# Patient Record
Sex: Female | Born: 1955 | Race: Black or African American | Hispanic: No | Marital: Married | Smoking: Former smoker
Health system: Southern US, Community
[De-identification: ages and names within clinical notes are randomized; demographics above are authoritative.]

## PROBLEM LIST (undated history)

## (undated) DIAGNOSIS — M654 Radial styloid tenosynovitis [de Quervain]: Secondary | ICD-10-CM

## (undated) DIAGNOSIS — J45909 Unspecified asthma, uncomplicated: Secondary | ICD-10-CM

## (undated) DIAGNOSIS — I10 Essential (primary) hypertension: Secondary | ICD-10-CM

## (undated) DIAGNOSIS — J302 Other seasonal allergic rhinitis: Secondary | ICD-10-CM

## (undated) DIAGNOSIS — K635 Polyp of colon: Secondary | ICD-10-CM

## (undated) HISTORY — DX: Unspecified asthma, uncomplicated: J45.909

## (undated) HISTORY — DX: Polyp of colon: K63.5

## (undated) HISTORY — DX: Other seasonal allergic rhinitis: J30.2

## (undated) HISTORY — DX: Radial styloid tenosynovitis (de quervain): M65.4

## (undated) HISTORY — DX: Essential (primary) hypertension: I10

---

## 2003-04-16 ENCOUNTER — Other Ambulatory Visit: Admission: RE | Admit: 2003-04-16 | Discharge: 2003-04-16 | Payer: Self-pay | Admitting: *Deleted

## 2003-05-12 HISTORY — PX: TOTAL ABDOMINAL HYSTERECTOMY W/ BILATERAL SALPINGOOPHORECTOMY: SHX83

## 2003-05-19 ENCOUNTER — Ambulatory Visit (HOSPITAL_COMMUNITY): Admission: RE | Admit: 2003-05-19 | Discharge: 2003-05-19 | Payer: Self-pay | Admitting: *Deleted

## 2003-05-19 ENCOUNTER — Encounter (INDEPENDENT_AMBULATORY_CARE_PROVIDER_SITE_OTHER): Payer: Self-pay | Admitting: *Deleted

## 2003-05-22 ENCOUNTER — Encounter (INDEPENDENT_AMBULATORY_CARE_PROVIDER_SITE_OTHER): Payer: Self-pay | Admitting: *Deleted

## 2003-05-22 ENCOUNTER — Inpatient Hospital Stay (HOSPITAL_COMMUNITY): Admission: RE | Admit: 2003-05-22 | Discharge: 2003-05-25 | Payer: Self-pay | Admitting: *Deleted

## 2003-12-09 ENCOUNTER — Emergency Department (HOSPITAL_COMMUNITY): Admission: AD | Admit: 2003-12-09 | Discharge: 2003-12-09 | Payer: Self-pay | Admitting: Family Medicine

## 2004-01-24 ENCOUNTER — Emergency Department (HOSPITAL_COMMUNITY): Admission: EM | Admit: 2004-01-24 | Discharge: 2004-01-24 | Payer: Self-pay | Admitting: Emergency Medicine

## 2005-01-19 ENCOUNTER — Emergency Department (HOSPITAL_COMMUNITY): Admission: EM | Admit: 2005-01-19 | Discharge: 2005-01-19 | Payer: Self-pay | Admitting: Emergency Medicine

## 2005-12-08 ENCOUNTER — Emergency Department (HOSPITAL_COMMUNITY): Admission: EM | Admit: 2005-12-08 | Discharge: 2005-12-08 | Payer: Self-pay | Admitting: Family Medicine

## 2006-05-08 ENCOUNTER — Emergency Department (HOSPITAL_COMMUNITY): Admission: EM | Admit: 2006-05-08 | Discharge: 2006-05-08 | Payer: Self-pay | Admitting: Family Medicine

## 2007-05-30 ENCOUNTER — Emergency Department (HOSPITAL_COMMUNITY): Admission: EM | Admit: 2007-05-30 | Discharge: 2007-05-30 | Payer: Self-pay | Admitting: Family Medicine

## 2007-08-13 ENCOUNTER — Emergency Department (HOSPITAL_COMMUNITY): Admission: EM | Admit: 2007-08-13 | Discharge: 2007-08-13 | Payer: Self-pay | Admitting: Emergency Medicine

## 2008-12-20 ENCOUNTER — Emergency Department (HOSPITAL_COMMUNITY): Admission: EM | Admit: 2008-12-20 | Discharge: 2008-12-20 | Payer: Self-pay | Admitting: Emergency Medicine

## 2011-04-28 NOTE — Discharge Summary (Signed)
NAME:  Natasha Bauer, Natasha Bauer NO.:  [id number]   MEDICAL RECORD NO.:  [id number]                   PATIENT TYPE:  INP   LOCATION:  0442                                 FACILITY:  [facility]   PHYSICIAN:  Pershing Cox, M.D.            DATE OF BIRTH:  11/05/1956   DATE OF ADMISSION:  05/22/2003  DATE OF DISCHARGE:  05/25/2003                                 DISCHARGE SUMMARY   ADMISSION DIAGNOSES:  1. Menometorrhagia.  2. Myomatous uterus.   For details of the patient's admission history and physical, please see the  transcribed note dated May 22, 2003.   HISTORY OF PRESENT ILLNESS:  Briefly, this patient has had an 18 week sized  uterus with very large uterine myomas.  She is brought to the operating room  today for hysterectomy because of anemia associated with bleeding with these  fibroids.   On the day of admission, the patient was taken to the operating room where  under general anesthesia exploratory laparotomy, total abdominal  hysterectomy, bilateral salpingo-oophorectomy was performed.  The procedure  was complicated by the position of the patient's uterine myomas which made  the operation quite complicated.  Her estimated blood loss was 1200 cc  during the operative procedure.   On the evening of surgery, the patient had pain which was well controlled  with her PCA.  She had a very low-grade fever, and her blood pressure was  elevated with a diastolic of 101.  She took a p.o. Ziac that evening and her  blood pressure normalized.  Her urine output was sluggish and she received a  bolus of normal saline.  On the morning of postoperative day #1, the patient  was doing well.  She felt that she was not emptying her bladder and had a  small headache.  She had an in and out catheterization performed after  voiding with a low residual.  On the morning of postoperative day #2, the  patient had no complaints other then gas pains.  There was a small  amount of  drainage on her lower abdomen, and for this reason her wound was probed  without evidence of seroma.  On the morning of postoperative day #3, the  patient had flatus since that morning.  She was nauseated, but she had been  fed sausage for breakfast.  Temperature max was 99.3.  She was able to have  a normal lunch and was able to be discharged that afternoon.  She was given  the following prescriptions:   DISCHARGE MEDICATIONS:  1. Tylox #20 to take one p.o. q.4h. p.r.n. severe pain.  2. Darvocet #40 one or two to take q.4h. p.r.n. mild to moderate pain.  3. Peri-Colace to take in association with any Tylox to prevent any     constipation.  4. Vivelle dot 0.05 mg for hormone replacement therapy.   Final pathology from the patient's surgery showed serosal endometriosis,  endometriosis  of the right ovary with simple paratubal cyst on the left.  Her endometrium showed secretory phase endometrium and leiomyomas  measuring up to 10 cm in size.  The patient's final hemoglobin was 10.4.  She was discharged to come to my office in approximately four days to have  her staples removed.   CONDITION ON DISCHARGE:  Stable.                                                 Pershing Cox, M.D.    MAJ/MEDQ  D:  06/01/2003  T:  06/01/2003  Job:  [id number]   cc:   Soyla Murphy. Renne Crigler, M.D.  [address]  [place]  [place] [address]  Fax: [phone]

## 2011-04-28 NOTE — Op Note (Signed)
   NAME:  Natasha Bauer, Natasha Bauer NO.:  [id number]   MEDICAL RECORD NO.:  [id number]                   PATIENT TYPE:  AMB   LOCATION:  ENDO                                 FACILITY:  [facility]   PHYSICIAN:  Georgiana Spinner, M.D.                 DATE OF BIRTH:  1956/06/19   DATE OF PROCEDURE:  05/19/2003  DATE OF DISCHARGE:                                 OPERATIVE REPORT   PROCEDURE PERFORMED:  Colonoscopy.   INDICATIONS FOR PROCEDURE:  Rectal bleeding.   ANESTHESIA:  Demerol 100, Versed 10 mg.   DESCRIPTION OF PROCEDURE:  With the patient mildly sedated in the left  lateral decubitus position, the Olympus videoscopic colonoscope was inserted  in the rectum and passed under direct vision to the cecum, identified by  ileocecal valve and appendiceal orifice, both of which were photographed.  From this point, the colonoscope was slowly withdrawn taking circumferential  views of the entire colonic mucosa.  Stopping only in the rectosigmoid which  showed some indentation from above to no invasion.  A small polyp was also  seen at that spot.  It was photographed as well.  It was removed using hot  biopsy forceps technique, setting of 20/20 blended current.  The endoscope  was then withdrawn all the way to the rectum which appeared normal on direct  and showed hemorrhoids on retroflexed view.  The endoscope was straightened  and withdrawn.  The patient's vital signs and pulse oximeter remained  stable.  The patient tolerated the procedure well without apparent  complications.   FINDINGS:  Internal hemorrhoids.  Small polyp in the rectosigmoid area with  some indentation from above, indicating some degree of compression from the  fibroid uterus; otherwise an unremarkable colonoscopic examination.   PLAN:  Await biopsy report.  The patient will call me for results and follow  up with me as an outpatient.                                               Georgiana Spinner,  M.D.    GMO/MEDQ  D:  05/19/2003  T:  05/19/2003  Job:  [id number]   cc:   Pershing Cox, M.D.  [address]  [address]  [place]  [place] [address]  Fax: [phone]

## 2011-04-28 NOTE — Op Note (Signed)
NAME:  JOEI, FRANGOS NO.:  [id number]   MEDICAL RECORD NO.:  [id number]                   PATIENT TYPE:  INP   LOCATION:  0001                                 FACILITY:  [facility]   PHYSICIAN:  Pershing Cox, M.D.            DATE OF BIRTH:  03/05/1956   DATE OF PROCEDURE:  05/22/2003  DATE OF DISCHARGE:                                 OPERATIVE REPORT   Natasha Bauer was seen in referral from Dr. Renne Crigler because of a history  of heavy bleeding and episodes of menometorrhagia. On examination, she had a  large mass rising from the pelvis extending to her umbilicus. She had an  ultrasound confirming the presence of some uterine myomas, some as large as  8 cm. She was brought to the operating room today for removal of this uterus  and has been counselled regarding the risks.   FINDINGS:  Examination under anesthesia confirmed the presence of a lower  uterine segment myoma which was large distorting the right perimetrium. For  this reason, a decision was made to make a vertical incision. This was a  good decision as later in the operation in order to get adequate exposure we  had to be able to extend through the umbilicus. The patient had multiple  uterine myomas. Some of these uterine myomas were distorting the normal  anatomy such that they required removal prior to being able to proceed with  the operation. The broader peritoneum was distorted over the lower uterine  segment because of its distortion over the myomas. The right uterine artery  could not be visualized in any way because of the uterine myomas. The  patient's ovaries were normal in appearance. She had requested oophorectomy.  The rectosigmoid was densely adherent to the left ovary and  infundibulopelvic ligament.   DESCRIPTION OF PROCEDURE:  Natasha Bauer was brought to the operating  room with an IV in place. She had received a g of Ancef in the holding area  and PAS stockings  were placed on her side and calves. Supine on the OR  table, general endotracheal anesthesia was administered. She was then placed  into frog leg position and examination under anesthesia was performed. Her  abdomen, perineum and vagina were prepped with Hibiclens. A Foley catheter  was sterilely inserted into the bladder. The patient was draped through a  midline incision and Marcaine was injected into the subumbilical tissues in  the midline. A skin incision was made beneath the umbilicus extending to the  symphysis. The subcutaneous tissues were separated by blunt dissection,  fascia was identified and opened with Bovie cautery extending to the  symphysis. The rectus muscles were separated in the midline and peritoneum  was tinted and opened atraumatically. There was no ascites. The peritoneal  incision was extended superiorly inferiorly to the maximum extent of the  created incision. Exploratory laparotomy was performed and the uterus was  carefully palpated. We removed  the uterus from the peritoneal cavity and  there was very little mobility. There was no side to side movement and very  little anterior movement because of the distortion anteriorly of the large  uterine myoma. A decision was made at this time to extend the incision. I  extended it through the umbilicus as her umbilicus was broad and easily  visible. When the incision had been adequately extended, moist laps were  placed into the upper abdomen in the gutters to retract the bowel. A Balfour  retractor was used to displace the lateral walls and a bladder blade was  placed as well.   Starting on the patient's left, the round ligament was identified and suture  ligated. Rectosigmoid was dissected off of its adherence to the  infundibulopelvic ligament and round ligament. The peritoneum along the  sidewall was opened and the ureter was visualized. The IP ligament could  then be clamped, cut, suture and free tie ligated. At  this point, there was  so little mobility we were unable to do anything else on this site. We  dissected the redundant peritoneum overlying the bladder from the mid  portion of the uterus. By completing this dissection on both sides, we were  then able to move the bladder off of the lower uterine segment.   On the patient's right, the round ligament was identified and transfixed  with suture and held for later in the case. Peritoneum around the round  ligament was incised connecting this to the bladder dissection. The  peritoneal incision was extended so that we could visualize the ureter.  Blood vessels creating the IP ligament were gathered into a bundle, the  pedicle was cut, suture and free tie ligated. The right ovary was densely  adherent to the right sidewall of the uterus extending posteriorly and had  to be taken up by sharp dissection.   Moving back to the left side of the uterus, the uterine artery could be  visualized. It was clamped high and then using a series of clamps cut,  suture and free tie ligated. Continued bleeding from along the sidewall of  the uterus prompted several stitches which contained the bleeding somewhat  but there was a steady ooze from this site until the right uterine artery  could be secured.   A decision was made to perform a myomectomy. Bovie cautery over the top of  the myoma allowed me to retract the serosa. Using towel clips and pressure,  I was able to dissect the myoma around and around until the base could be  secured. Once this had been freed, the bladder was carefully taken down off  of the remainder of the lower uterine segment and the uterine artery was  clamped along the sidewall of the uterus. The ureter was visualized to be  certain that it was safe. This artery was cut, suture and free tie ligated.  A second clamp was placed medial to the uterine artery so that it could be doubly ligated. At this point with both uterine arteries cut,  there was  still a steady ooze from the fundus. A malleable was placed into the cul-de-  sac and using a knife, the fundus of the uterus was excised. Long clamps  were placed along the side of the cervix as it entered the cardinal  ligament. These clamps were cut and Heaney ligated. The uterosacral  ligaments were developed on both sides and Heaney ligated. These were held  for later in the  case. In dissecting down the cervix, there was about a 4 cm  length left. We reached the vagina on the patient's left. The remaining  cervix was excised from the upper vagina. Angled sutures were placed on each  side of the vagina. The vagina was then closed side to side with interrupted  figure-of-eight sutures. The held uterosacral ligaments were tied to the  vaginal angles.   Hemostasis was obtained by cautery and where necessary 3-0 Vicryl sutures. A  small suture was placed in the peritoneum above the right ureter. The ureter  was carefully visualized prior to doing this. There was no bleeding from the  angles. There was bleeding from the base of the bladder and this was  contained by cautery as well. The peritoneal cavity was irrigated and once  hemostasis had been ensured in the pelvis, laps were removed.   The fascia was closed with a running looped double stranded Prolene from the  superior portion of the incision meeting the lower portion at about 3/4 of a  distance of the upper incision. The subcutaneous tissues were closed  using a 3-0 Vicryl interrupted stitch. The upper abdominal skin was closed  using a Monocryl suture, the lower portion of the incision was closed with  skin staples. Estimated blood loss 1200 mL. Urine output 100 mL. Fluids 2400  mL of crystalloid.                                               Pershing Cox, M.D.    MAJ/MEDQ  D:  05/22/2003  T:  05/22/2003  Job:  [id number]   cc:   Gerri Spore B. Earlene Plater, M.D.  [address]  [place]  [place] [address]   Fax: [phone]   Soyla Murphy. Renne Crigler, M.D.  [address]  [place]  [place] [address]  Fax: [phone]

## 2012-07-31 ENCOUNTER — Ambulatory Visit (INDEPENDENT_AMBULATORY_CARE_PROVIDER_SITE_OTHER): Payer: 59 | Admitting: Family Medicine

## 2012-07-31 ENCOUNTER — Encounter: Payer: Self-pay | Admitting: Family Medicine

## 2012-07-31 VITALS — BP 180/104 | HR 60 | Ht 60.0 in | Wt 216.0 lb

## 2012-07-31 DIAGNOSIS — Z800 Family history of malignant neoplasm of digestive organs: Secondary | ICD-10-CM

## 2012-07-31 DIAGNOSIS — I10 Essential (primary) hypertension: Secondary | ICD-10-CM | POA: Insufficient documentation

## 2012-07-31 DIAGNOSIS — J45909 Unspecified asthma, uncomplicated: Secondary | ICD-10-CM | POA: Insufficient documentation

## 2012-07-31 DIAGNOSIS — J309 Allergic rhinitis, unspecified: Secondary | ICD-10-CM | POA: Insufficient documentation

## 2012-07-31 MED ORDER — ALBUTEROL SULFATE HFA 108 (90 BASE) MCG/ACT IN AERS: 2.0000 | INHALATION_SPRAY | Freq: Four times a day (QID) | RESPIRATORY_TRACT | Status: DC | PRN

## 2012-07-31 MED ORDER — LISINOPRIL-HYDROCHLOROTHIAZIDE 10-12.5 MG PO TABS: 1.0000 | ORAL_TABLET | Freq: Every day | ORAL | Status: DC

## 2012-07-31 MED ORDER — MONTELUKAST SODIUM 10 MG PO TABS: 10.0000 mg | ORAL_TABLET | Freq: Every day | ORAL | Status: DC

## 2012-07-31 MED ORDER — FLUTICASONE PROPIONATE 50 MCG/ACT NA SUSP: 2.0000 | Freq: Every day | NASAL | Status: DC

## 2012-07-31 NOTE — Patient Instructions (Addendum)
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  Start taking lisinopril HCTZ every day.  Try and check your blood pressure periodically and bring your machine and the list of blood pressures that you've checked to your next appointment.  This way we can verify the accuracy of your monitor.  Take singulair every day.  This may take 1-2 weeks to become fully effective.  Continue the albuterol as needed.  If this medication doesn't completely help with your congestion and allergy symptoms, then start the Fluticasone nasal spray (this is generic for Flonase, similar to Nasonex but less expensive).    Avoid decongestants ("sinus medications') as these raise blood pressure. Try using Neti-pot or Sinus Rinse kits to help with sinus pressure (once or twice daily)  Use Mucinex as needed for thick mucus (from sinuses or chest)  PLEASE DO NOT TAKE OTHER PEOPLE'S PRESCRIPTION MEDICATIONS!!!   2 Gram Low Sodium Diet A 2 gram sodium diet restricts the amount of sodium in the diet to no more than 2 g or 2000 mg daily. Limiting the amount of sodium is often used to help lower blood pressure. It is important if you have heart, liver, or kidney problems. Many foods contain sodium for flavor and sometimes as a preservative. When the amount of sodium in a diet needs to be low, it is important to know what to look for when choosing foods and drinks. The following includes some information and guidelines to help make it easier for you to adapt to a low sodium diet. QUICK TIPS  Do not add salt to food.   Avoid convenience items and fast food.   Choose unsalted snack foods.   Buy lower sodium products, often labeled as "lower sodium" or "no salt added."   Check food labels to learn how much sodium is in 1 serving.   When eating at a restaurant, ask that your food be prepared with less salt or  none, if possible.  READING FOOD LABELS FOR SODIUM INFORMATION The nutrition facts label is a good place to find how much sodium is in foods. Look for products with no more than 500 to 600 mg of sodium per meal and no more than 150 mg per serving. Remember that 2 g = 2000 mg. The food label may also list foods as:  Sodium-free: Less than 5 mg in a serving.   Very low sodium: 35 mg or less in a serving.   Low-sodium: 140 mg or less in a serving.   Light in sodium: 50% less sodium in a serving. For example, if a food that usually has 300 mg of sodium is changed to become light in sodium, it will have 150 mg of sodium.   Reduced sodium: 25% less sodium in a serving. For example, if a food that usually has 400 mg of sodium is changed to reduced sodium, it will have 300 mg of sodium.  CHOOSING FOODS Grains  Avoid: Salted crackers and snack items. Some cereals, including instant hot cereals. Bread stuffing and biscuit mixes. Seasoned rice or pasta mixes.   Choose: Unsalted snack items. Low-sodium cereals, oats, puffed wheat and rice, shredded wheat. English muffins and bread. Pasta.  Meats  Avoid: Salted, canned, smoked, spiced, pickled meats, including fish and poultry. Bacon, ham, sausage, cold cuts, hot dogs, anchovies.   Choose: Low-sodium canned tuna and salmon. Fresh or frozen meat, poultry, and  fish.  Dairy  Avoid: Processed cheese and spreads. Cottage cheese. Buttermilk and condensed milk. Regular cheese.   Choose: Milk. Low-sodium cottage cheese. Yogurt. Sour cream. Low-sodium cheese.  Fruits and Vegetables  Avoid: Regular canned vegetables. Regular canned tomato sauce and paste. Frozen vegetables in sauces. Olives. Rosita Fire. Relishes. Sauerkraut.   Choose: Low-sodium canned vegetables. Low-sodium tomato sauce and paste. Frozen or fresh vegetables. Fresh and frozen fruit.  Condiments  Avoid: Canned and packaged gravies. Worcestershire sauce. Tartar sauce. Barbecue sauce. Soy  sauce. Steak sauce. Ketchup. Onion, garlic, and table salt. Meat flavorings and tenderizers.   Choose: Fresh and dried herbs and spices. Low-sodium varieties of mustard and ketchup. Lemon juice. Tabasco sauce. Horseradish.  SAMPLE 2 GRAM SODIUM MEAL PLAN Breakfast / Sodium (mg)  1 cup low-fat milk / 143 mg   2 slices whole-wheat toast / 270 mg   1 tbs heart-healthy margarine / 153 mg   1 hard-boiled egg / 139 mg   1 small orange / 0 mg  Lunch / Sodium (mg)  1 cup raw carrots / 76 mg    cup hummus / 298 mg   1 cup low-fat milk / 143 mg    cup red grapes / 2 mg   1 whole-wheat pita bread / 356 mg  Dinner / Sodium (mg)  1 cup whole-wheat pasta / 2 mg   1 cup low-sodium tomato sauce / 73 mg   3 oz lean ground beef / 57 mg   1 small side salad (1 cup raw spinach leaves,  cup cucumber,  cup yellow bell pepper) with 1 tsp olive oil and 1 tsp red wine vinegar / 25 mg  Snack / Sodium (mg)  1 container low-fat vanilla yogurt / 107 mg   3 graham cracker squares / 127 mg  Nutrient Analysis  Calories: 2033   Protein: 77 g   Carbohydrate: 282 g   Fat: 72 g   Sodium: 1971 mg  Document Released: 11/27/2005 Document Revised: 11/16/2011 Document Reviewed: 02/28/2010 [facility] Patient Information 2012 [place], [place].  Diet for GERD or PUD Nutrition therapy can help ease the discomfort of gastroesophageal reflux disease (GERD) and peptic ulcer disease (PUD).  HOME CARE INSTRUCTIONS   Eat your meals slowly, in a relaxed setting.   Eat 5 to 6 small meals per day.   If a food causes distress, stop eating it for a period of time.  FOODS TO AVOID  Coffee, regular or decaffeinated.   Cola beverages, regular or low calorie.   Tea, regular or decaffeinated.   Pepper.   Cocoa.   High fat foods, including meats.   Butter, margarine, hydrogenated oil (trans fats).   Peppermint or spearmint (if you have GERD).   Fruits and vegetables if not tolerated.   Alcohol.    Nicotine (smoking or chewing). This is one of the most potent stimulants to acid production in the gastrointestinal tract.   Any food that seems to aggravate your condition.  If you have questions regarding your diet, ask your caregiver or a registered dietitian. TIPS  Lying flat may make symptoms worse. Keep the head of your bed raised 6 to 9 inches (15 to 23 cm) by using a foam wedge or blocks under the legs of the bed.   Do not lay down until 3 hours after eating a meal.   Daily physical activity may help reduce symptoms.  MAKE SURE YOU:   Understand these instructions.   Will watch your condition.  Will get help right away if you are not doing well or get worse.  Document Released: 11/27/2005 Document Revised: 11/16/2011 Document Reviewed: 10/13/2011 [facility] Patient Information 2012 [place], [place].

## 2012-07-31 NOTE — Progress Notes (Signed)
Chief Complaint  Patient presents with  . Hypertension    new patient to establish today with htn issues. Has ordered her records from Dr. Gaylyn Lambert Prev[facility] but we have not recieved as of yet.   HPI:  Patient is here to establish care, and is requesting refills on her BP medications.  Hypertension:  Diagnosed when she was 20.  Was able to get off medications with weight loss, but then back on after regaining weight.  Most recently she had been taking Diovan HCT 320/25 as prescribed by her former physician--however she recalls getting some splotches on her skin and headaches after taking the medication (every time she picked up a new rx from the pharmacy, lasting 3-4 days).  Due to insurance issues, she hasn't actually taken the Diovan HCT in about 8 months.  Instead, she has taken some of her sister's lisinopril HCT 10/12.5.  She denies having any cough or side effects from this medication.  She hasn't taken any blood pressure medications for 2 weeks.  She recalls BP's being 120's/80's while on the lisinopril medication.  Only recalls having higher BP's when she had pain (sinus infection, headache, asthma, etc).  Ankles seem to be swelling some since she has been out of blood pressure medication.  Has a headache currently, across her forehead/sinuses.  Denies chest pain, palpitations.   Asthma/allergies:  She has had problems with her sinuses and asthma for over 10 years.  She had asthma since childhood.  She had been on Nasonex with good control of her sinuses, but has run out.  It was >$100/month.  She started having some itchy ears, slight cough from postnasal drip last week.  Started having some chest congestion last week, so started using her inhaler, which has helped.  She reports that oral antihistamines seem to work against her, making her worse rather than better, which is why she was put on nasal steroid spray.  Doesn't recall every having tried flonase/fluticasone, nor has she ever  taken Singulair.  Health Maintenance: States she had tetanus through employee health/occ med in May.  Reports having had pneumovax through Dr. Carolee Rota office last year.  Was to have colonoscopy last year, but didn't due to insurance issues.  Past Medical History  Diagnosis Date  . Hypertension age 56  . Asthma   . Seasonal allergies     Past Surgical History  Procedure Date  . Total abdominal hysterectomy w/ bilateral salpingoophorectomy 05/2003    fibroids    History   Social History  . Marital Status: Married    Spouse Name: N/A    Number of Children: N/A  . Years of Education: N/A   Occupational History  . food Market researcher, and private caregiver [employer]   Social History Main Topics  . Smoking status: Former Smoker -- 0.5 packs/day for 8 years    Types: Cigarettes    Quit date: 12/12/1999  . Smokeless tobacco: Never Used  . Alcohol Use: No  . Drug Use: No  . Sexually Active: Yes -- Female partner(s)    Birth Control/ Protection: Surgical   Other Topics Concern  . Not on file   Social History Narrative   Lives with her husband. No children, 1 dog    Family History  Problem Relation Age of Onset  . Hypertension Father   . Cancer Mother 26    colon cancer  . Diabetes Mother   . Arthritis Sister     rheumatoid arthritis  . Hypertension Sister   .  Arthritis Brother     rheumatoid arthritis  . Heart disease Neg Hx     Current outpatient prescriptions:albuterol (PROVENTIL HFA;VENTOLIN HFA) 108 (90 BASE) MCG/ACT inhaler, Inhale 2 puffs into the lungs every 6 (six) hours as needed for wheezing or shortness of breath., Disp: 18 g, Rfl: 1;  Multiple Vitamins-Minerals (CENTRUM SILVER PO), Take 1 tablet by mouth daily., Disp: , Rfl: ;  DISCONTD: albuterol (PROVENTIL HFA;VENTOLIN HFA) 108 (90 BASE) MCG/ACT inhaler, Inhale 2 puffs into the lungs as needed., Disp: , Rfl:  fluticasone (FLONASE) 50 MCG/ACT nasal spray, Place 2 sprays into the nose daily., Disp: 16 g,  Rfl: 6;  lisinopril-hydrochlorothiazide (PRINZIDE,ZESTORETIC) 10-12.5 MG per tablet, Take 1 tablet by mouth daily., Disp: 30 tablet, Rfl: 0;  montelukast (SINGULAIR) 10 MG tablet, Take 1 tablet (10 mg total) by mouth at bedtime., Disp: 30 tablet, Rfl: 5 DISCONTD: lisinopril-hydrochlorothiazide (PRINZIDE,ZESTORETIC) 10-12.5 MG per tablet, Take 1 tablet by mouth daily., Disp: , Rfl:   Allergies  Allergen Reactions  . Codeine Other (See Comments)    headache  . Penicillins Other (See Comments)    headache  . Sinus & Allergy (Pseudoephedrine) Other (See Comments)    Sinus medications do not work for her, they dry her up so badly that they give her asthma attacks.   ROS:  Denies fevers, recent weight changes. Denies nausea, vomiting, diarrhea, bowel changes.  Denies chest pain, palpitations. Occasional heartburn/reflux--recently took someone's Nexium with good results.  Denies skin rashes, bleeding, bruising, depression, anxiety. +edema, shortness of breath/wheezing recently, sinus headache. See HPI  PHYSICAL EXAM: BP 176/100  Pulse 60  Ht 5' (1.524 m)  Wt 216 lb (97.977 kg)  BMI 42.18 kg/m2 180/104 by MD on RA Well developed, pleasant female in no distres HEENT:  PERRL, EOMI, conjunctiva clear.  TM's and EAC's normal.  Nasal mucosa mildly edematous, no purulence or erythema.  Sinuses nontender. Thyroid--borderline size, no mass.  No cervical lymphadenopathy Heart: regular rate and rhythm without murmur Lungs: clear bilaterally Abdomen: soft, nontender, no organomegaly or mass Extremities: no pitting edema, 2+ pulse Neuro: alert and oriented, cranial nerves intact.  Normal strength, gait Psych: normal mood, affect, hygiene and grooming Skin: no rashes, bruising  ASSESSMENT/PLAN: 1. Essential hypertension, benign  lisinopril-hydrochlorothiazide (PRINZIDE,ZESTORETIC) 10-12.5 MG per tablet  2. Asthma  montelukast (SINGULAIR) 10 MG tablet, albuterol (PROVENTIL HFA;VENTOLIN HFA) 108 (90  BASE) MCG/ACT inhaler  3. Allergic rhinitis, cause unspecified  fluticasone (FLONASE) 50 MCG/ACT nasal spray    HTN--currently very high due to being out of medications.  She reports that BP's were okay on her sister's lisinopril HCTZ, so will restart that rather than the diovan HCT which caused her to feel bad.  She will need to return for f/u on blood pressure in 3-4 weeks to ensure this dose is adequate.  Low sodium diet.  Monitor BP elsewhere and bring list, monitor.  Asthma and allergies:  Start singulair 1 tablet daily.  If ineffective in treating allergy symptoms adequately, then start Flonase (as Nasonex was unaffordable).  Continue to use albuterol as needed.  Await Dr. Carolee Rota records to know if/when labs are due.  We should have this by next visit  Family h/o colon cancer in her mother.  She had a colonoscopy approximately 6-7 years ago, and is past due--due every 5 years.  Done through someone at Dr. Leonides Grills records.  She was due last year, but had insurance issues  GERD--reviewed diet.  Don't use other people's medications  Will need  to schedule mammogram and colonoscopy.   Refer to GI (may need to wait for records)

## 2012-08-01 ENCOUNTER — Encounter (HOSPITAL_COMMUNITY): Payer: Self-pay | Admitting: Emergency Medicine

## 2012-08-01 ENCOUNTER — Emergency Department (HOSPITAL_COMMUNITY)
Admission: EM | Admit: 2012-08-01 | Discharge: 2012-08-01 | Disposition: A | Discharge status: Home / Self Care | Payer: 59 | Attending: Emergency Medicine | Admitting: Emergency Medicine

## 2012-08-01 DIAGNOSIS — Z90710 Acquired absence of both cervix and uterus: Secondary | ICD-10-CM | POA: Insufficient documentation

## 2012-08-01 DIAGNOSIS — Z9079 Acquired absence of other genital organ(s): Secondary | ICD-10-CM | POA: Insufficient documentation

## 2012-08-01 DIAGNOSIS — I10 Essential (primary) hypertension: Secondary | ICD-10-CM | POA: Insufficient documentation

## 2012-08-01 DIAGNOSIS — I160 Hypertensive urgency: Secondary | ICD-10-CM

## 2012-08-01 DIAGNOSIS — Z79899 Other long term (current) drug therapy: Secondary | ICD-10-CM | POA: Insufficient documentation

## 2012-08-01 LAB — BASIC METABOLIC PANEL
Calcium: 9.4 mg/dL (ref 8.4–10.5)
GFR calc Af Amer: 85 mL/min — ABNORMAL LOW (ref >90)
GFR calc non Af Amer: 74 mL/min — ABNORMAL LOW (ref >90)
Glucose, Bld: 92 mg/dL (ref 70–99)
Potassium: 4.1 mEq/L (ref 3.5–5.1)
Sodium: 136 mEq/L (ref 135–145)

## 2012-08-01 LAB — TROPONIN I: Troponin I: <0.3 ng/mL (ref <0.30)

## 2012-08-01 MED ORDER — LISINOPRIL 10 MG PO TABS
10.0000 mg | ORAL_TABLET | Freq: Once | ORAL | Status: AC
  Administered 2012-08-01: 10 mg via ORAL
  Filled 2012-08-01: qty 1

## 2012-08-01 NOTE — ED Notes (Signed)
Report received-airway intact-no s/s's of distress-will continue to monitor 

## 2012-08-01 NOTE — ED Notes (Signed)
Pt alert, arrives from home, c/o HTN, seen In PCP today, b/p elevated, resp even unlabored, skin pwd, denies h/a

## 2012-08-01 NOTE — ED Provider Notes (Signed)
History     CSN: [id number]  Arrival date & time 08/01/12  3086   First MD Initiated Contact with Patient 08/01/12 0500      Chief Complaint  Patient presents with  . Hypertension    (Consider location/radiation/quality/duration/timing/severity/associated sxs/prior treatment) HPI Comments: Pt with hx of HTN, just switched to lisinopril/HCTZ combo comes in with cc of elevated BP. Pt states that she checked her BP, and it has been ranging between 170-190 systolic, and decided to come to the ER to make sure everything was OK. She has no n/v/chestpain/sob/headaches/ams.   Patient is a 56 y.o. female presenting with hypertension. The history is provided by the patient.  Hypertension Pertinent negatives include no chest pain, no abdominal pain, no headaches and no shortness of breath.    Past Medical History  Diagnosis Date  . Hypertension age 76  . Asthma   . Seasonal allergies     Past Surgical History  Procedure Date  . Total abdominal hysterectomy w/ bilateral salpingoophorectomy 05/2003    fibroids    Family History  Problem Relation Age of Onset  . Hypertension Father   . Cancer Mother 34    colon cancer  . Diabetes Mother   . Arthritis Sister     rheumatoid arthritis  . Hypertension Sister   . Arthritis Brother     rheumatoid arthritis  . Heart disease Neg Hx     History  Substance Use Topics  . Smoking status: Former Smoker -- 0.5 packs/day for 8 years    Types: Cigarettes    Quit date: 12/12/1999  . Smokeless tobacco: Never Used  . Alcohol Use: No    OB History    Grav Para Term Preterm Abortions TAB SAB Ect Mult Living   0 0 0 0 0 0 0 0 0 0       Review of Systems  HENT: Negative for neck pain.   Respiratory: Negative for shortness of breath.   Cardiovascular: Negative for chest pain.  Gastrointestinal: Negative for nausea, vomiting and abdominal pain.  Genitourinary: Negative for dysuria.  Neurological: Negative for headaches.    Allergies    Codeine; Penicillins; and Sinus & allergy  Home Medications   Current Outpatient Rx  Name Route Sig Dispense Refill  . ALBUTEROL SULFATE HFA 108 (90 BASE) MCG/ACT IN AERS Inhalation Inhale 2 puffs into the lungs every 6 (six) hours as needed for wheezing or shortness of breath. 18 g 1  . FLUTICASONE PROPIONATE 50 MCG/ACT NA SUSP Nasal Place 2 sprays into the nose daily. 16 g 6  . LISINOPRIL-HYDROCHLOROTHIAZIDE 10-12.5 MG PO TABS Oral Take 1 tablet by mouth daily. 30 tablet 0  . MONTELUKAST SODIUM 10 MG PO TABS Oral Take 1 tablet (10 mg total) by mouth at bedtime. 30 tablet 5  . CENTRUM SILVER PO Oral Take 1 tablet by mouth daily.      BP 192/117  Pulse 87  Temp 98 F (36.7 C)  Resp 16  SpO2 99%  Physical Exam  Nursing note and vitals reviewed. Constitutional: She is oriented to person, place, and time. She appears well-developed and well-nourished.  HENT:  Head: Normocephalic and atraumatic.  Eyes: EOM are normal. Pupils are equal, round, and reactive to light.  Neck: Neck supple.  Cardiovascular: Normal rate, regular rhythm and normal heart sounds.   No murmur heard. Pulmonary/Chest: Effort normal. No respiratory distress.  Abdominal: Soft. She exhibits no distension. There is no tenderness. There is no rebound and no guarding.  Neurological: She is alert and oriented to person, place, and time.  Skin: Skin is warm and dry.    ED Course  Procedures (including critical care time)   Labs Reviewed  BASIC METABOLIC PANEL  TROPONIN I   No results found.   No diagnosis found.    MDM   Date: 08/01/2012  Rate: 66  Rhythm: normal sinus rhythm  QRS Axis: normal  Intervals: normal  ST/T Wave abnormalities: normal  Conduction Disutrbances: none  Narrative Interpretation: unremarkable   Pt comes in with cc of HTN. Pt's BP at my eval is 170/105 - but she has been having pressures above the 180 mmhg mark She is asymptomatic. Will get end organ damage labs and  reassess.        Derwood Kaplan, MD 08/01/12 [phone]

## 2012-08-02 ENCOUNTER — Telehealth: Payer: Self-pay | Admitting: Family Medicine

## 2012-08-02 NOTE — Telephone Encounter (Signed)
Patient called in reference to her BP. She states that she has already taken her BP medication this morning. She check her BP and her BP was 204/118 and she wanted to know what she should do about this. While the patient was on hold and we where speaking to Crosby Oyster PA-C about her her issue with her BP. I returned back to the phone and the patient states that she re-check her BP and it went down to 190/109. I explain this to [facility] and he then states he will speak with her directly. CLS

## 2012-08-06 NOTE — Telephone Encounter (Signed)
She will c/t same medication and recheck here with nurse visit next week for BP check.

## 2012-08-08 ENCOUNTER — Encounter: Payer: Self-pay | Admitting: Internal Medicine

## 2012-08-20 HISTORY — PX: COLONOSCOPY: SHX174

## 2012-08-21 ENCOUNTER — Ambulatory Visit (INDEPENDENT_AMBULATORY_CARE_PROVIDER_SITE_OTHER): Payer: 59 | Admitting: Family Medicine

## 2012-08-21 ENCOUNTER — Encounter: Payer: Self-pay | Admitting: Family Medicine

## 2012-08-21 VITALS — BP 134/86 | HR 80 | Ht 60.0 in | Wt 208.0 lb

## 2012-08-21 DIAGNOSIS — Z23 Encounter for immunization: Secondary | ICD-10-CM

## 2012-08-21 DIAGNOSIS — I10 Essential (primary) hypertension: Secondary | ICD-10-CM

## 2012-08-21 DIAGNOSIS — J45909 Unspecified asthma, uncomplicated: Secondary | ICD-10-CM

## 2012-08-21 DIAGNOSIS — J309 Allergic rhinitis, unspecified: Secondary | ICD-10-CM

## 2012-08-21 MED ORDER — LISINOPRIL-HYDROCHLOROTHIAZIDE 10-12.5 MG PO TABS: 1.0000 | ORAL_TABLET | Freq: Every day | ORAL | Status: DC

## 2012-08-21 NOTE — Progress Notes (Signed)
Chief Complaint  Patient presents with  . Hypertension    3 weekbp recheck.   HPI:  Patient presents for f/u on her hypertension, having restarted her on lisinopril HCT at her visit 3 weeks ago.  She ended up going to ER 8/22 (day after her visit here) with elevated blood pressures (asymptomatic).  b-met and troponin, and EKG were normal.  BP's at home on her wrist monitor have been running 117-141/76-95, mainly running 120's-130's/80's. She denies headaches, chest pain, shortness of breath, palpitations.  The day she went to the ER she had some tingling in her hands, so got worried.  No further problems with this.  She is eating healthier, cut out her Cokes (just occasional ginger ale).  She has lost 8 pounds since her last visit, and is feeling very good.  She isn't able to exercise due to working 2 jobs, but walks a lot throughout the day.  Asthma and allergies:  She was also started on singulair at her last visit. Allergies are "doing good", as is her breathing.  She hasn't needed to use rescue inhaler since she started.   Had colonoscopy yesterday--one sessile polyp was noted, and pathology is pending.  Old records were received and reviewed--immunizations added to proper section in Epic (pneumovax and TdaP, UTD). Last labs in old records: 12/2010--normal CBC, chem.  Chol 219, HDL 105, TG 86; LDL 97, chol/HDL ratio 2.1, normal u/a; normal TSH at 0.590  Past Medical History  Diagnosis Date  . Hypertension age 56  . Asthma   . Seasonal allergies   . Colon polyp     hyperplastic 2004; 2013 pending  . Tenosynovitis, de Quervain     Dr. Amanda Pea, resolved   Past Surgical History  Procedure Date  . Total abdominal hysterectomy w/ bilateral salpingoophorectomy 05/2003    fibroids  . Colonoscopy 08/20/12    Dr. Elnoria Howard (sessile polyp)   History   Social History  . Marital Status: Married    Spouse Name: N/A    Number of Children: N/A  . Years of Education: N/A   Occupational History    . 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 Smoking status: Former Smoker -- 0.5 packs/day for 8 years    Types: Cigarettes    Quit date: 12/12/1999  . Smokeless tobacco: Never Used  . Alcohol Use: No  . Drug Use: No  . Sexually Active: Yes -- Female partner(s)    Birth Control/ Protection: Surgical   Other Topics Concern  . Not on file   Social History Narrative   Lives with her husband. No children, 1 dog   Current Outpatient Prescriptions on File Prior to Visit  Medication Sig Dispense Refill  . lisinopril-hydrochlorothiazide (PRINZIDE,ZESTORETIC) 10-12.5 MG per tablet Take 1 tablet by mouth daily.  30 tablet  0  . montelukast (SINGULAIR) 10 MG tablet Take 1 tablet (10 mg total) by mouth at bedtime.  30 tablet  5  . Multiple Vitamins-Minerals (CENTRUM SILVER PO) Take 1 tablet by mouth daily.      Marland Kitchen albuterol (PROVENTIL HFA;VENTOLIN HFA) 108 (90 BASE) MCG/ACT inhaler Inhale 2 puffs into the lungs every 6 (six) hours as needed for wheezing or shortness of breath.  18 g  1  . fluticasone (FLONASE) 50 MCG/ACT nasal spray Place 2 sprays into the nose daily.  16 g  6   Allergies  Allergen Reactions  . Codeine Other (See Comments)    headache  .  Penicillins Other (See Comments)    headache  . Sinus & Allergy (Pseudoephedrine) Other (See Comments)    Sinus medications do not work for her, they dry her up so badly that they give her asthma attacks.   ROS:  Denies fevers, URI symptoms, cough, shortness of breath, headaches, dizziness, tingling, weakness, chest pain, palpitations, GI complaints, GU complaints, rashes, edema or other concerns.  She has some popping in her right ear.  PHYSICAL EXAM: BP 134/86  Pulse 80  Ht 5' (1.524 m)  Wt 208 lb (94.348 kg)  BMI 40.62 kg/m2 Well developed, pleasant female in no distres HEENT:  PERRL, conjunctiva clear.  No nasal drainage or purulence.  OP clear.  TM's and EAC's normal on right. Neck: No  cervical lymphadenopathy or masses Heart: regular rate and rhythm without murmur  Lungs: clear bilaterally  Abdomen: soft, nontender, no organomegaly or mass  Extremities: no pitting edema, 2+ pulse  Neuro: alert and oriented, cranial nerves intact. Normal strength, gait  Psych: normal mood, affect, hygiene and grooming  Skin: no rashes, bruising  BP's also checked with patients 2 monitors she brought in--the wrist monitor appears accurate.  Recent labs from ER:   Chemistry      Component Value Date/Time   NA 136 08/01/2012 0549   K 4.1 08/01/2012 0549   CL 100 08/01/2012 0549   CO2 27 08/01/2012 0549   BUN 18 08/01/2012 0549   CREATININE 0.88 08/01/2012 0549      Component Value Date/Time   CALCIUM 9.4 08/01/2012 0549     ASSESSMENT/PLAN:  1. Essential hypertension, benign  lisinopril-hydrochlorothiazide (PRINZIDE,ZESTORETIC) 10-12.5 MG per tablet  2. Asthma    3. Allergic rhinitis, cause unspecified    4. Need for prophylactic vaccination and inoculation against influenza  Flu vaccine greater than or equal to 3yo preservative free IM    HTN improved--continue current dose.  Asthma and allergies improved.  Continue singulair.  If/when allergies flare, discussed adding antihistamine, and call for Flonase (she previously used Nasonex but too expensive) if needed.  Immunizations UTD.  Flu shot given today.  Colonoscopy yesterday with polyp--pathology of polyp pending (previously had hyperplastic polyps)  Reminded to schedule mammogram.  All questions and concerns were addressed.  F/u 6 months at CPE (r/s CPE she has in October)

## 2012-08-21 NOTE — Patient Instructions (Addendum)
Keep up the good work.  Try and exercise 30 minutes of aerobic exercise daily. Continue weight loss, low sodium diet.  Continue your current medications.  Please remember to schedule your mammogram.  If your allergies aren't well controlled with the Singulair and an antihistamine (ie claritin or zyrtec or allegra), then call for Flonase prescription as we discussed.

## 2012-08-23 ENCOUNTER — Encounter: Payer: Self-pay | Admitting: Family Medicine

## 2012-10-03 ENCOUNTER — Encounter: Payer: Self-pay | Admitting: Family Medicine

## 2012-12-23 ENCOUNTER — Ambulatory Visit (INDEPENDENT_AMBULATORY_CARE_PROVIDER_SITE_OTHER): Payer: 59 | Admitting: Family Medicine

## 2012-12-23 ENCOUNTER — Encounter: Payer: Self-pay | Admitting: Family Medicine

## 2012-12-23 VITALS — BP 152/102 | HR 72 | Temp 98.2°F | Ht 60.0 in | Wt 219.0 lb

## 2012-12-23 DIAGNOSIS — J45909 Unspecified asthma, uncomplicated: Secondary | ICD-10-CM

## 2012-12-23 DIAGNOSIS — I10 Essential (primary) hypertension: Secondary | ICD-10-CM

## 2012-12-23 DIAGNOSIS — J069 Acute upper respiratory infection, unspecified: Secondary | ICD-10-CM

## 2012-12-23 MED ORDER — SULFAMETHOXAZOLE-TRIMETHOPRIM 800-160 MG PO TABS: 1.0000 | ORAL_TABLET | Freq: Two times a day (BID) | ORAL | Status: DC

## 2012-12-23 MED ORDER — IPRATROPIUM BROMIDE 0.06 % NA SOLN: 2.0000 | Freq: Four times a day (QID) | NASAL | Status: DC

## 2012-12-23 NOTE — Progress Notes (Signed)
Chief Complaint  Patient presents with  . Sinusitis    thinks she has a sinus infection that started last Wed. mucus is green in color. No coughing as of yet. Monday b/l ear pain and fullness.   HPI: Began 5 days ago with nasal congestion, sneezing, runny nose.  Having ears popping bilaterally, and sinus pressure in her cheeks.  +PND, phlegm is greenish.  Denies sore throat.  Had some wheezing 4 days ago, needed to use inhaler just one day, not wheezing since.  Had some sweats early on, no known fevers.  Yesterday she lost her voice.   +passive tobacco exposure from her husband. She gets similar illness every January.  Allergies don't usually bother her until the spring.  She uses her Singulair every night, but hasn't been needing Flonase--just started that back up with this illness.  Hasn't really made any difference. She seems very disappointed that it hasn't helped. Denies nausea.  Had loose stool after using Singulair twice in the last few days  Past Medical History  Diagnosis Date  . Hypertension age 59  . Asthma   . Seasonal allergies   . Colon polyp     hyperplastic 2004; 2013 pending  . Tenosynovitis, de Quervain     Dr. Amanda Pea, resolved   Past Surgical History  Procedure Date  . Total abdominal hysterectomy w/ bilateral salpingoophorectomy 05/2003    fibroids  . Colonoscopy 08/20/12    Dr. Elnoria Howard (sessile polyp)   History   Social History  . Marital Status: Married    Spouse Name: N/A    Number of Children: N/A  . Years of Education: N/A   Occupational History  . 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 Smoking status: Former Smoker -- 0.5 packs/day for 8 years    Types: Cigarettes    Quit date: 12/12/1999  . Smokeless tobacco: Never Used  . Alcohol Use: No  . Drug Use: No  . Sexually Active: Yes -- Female partner(s)    Birth Control/ Protection: Surgical   Other Topics Concern  . Not on file   Social History  Narrative   Lives with her husband. No children, 1 dog    Current outpatient prescriptions:aspirin 81 MG tablet, Take 81 mg by mouth daily., Disp: , Rfl: ;  fluticasone (FLONASE) 50 MCG/ACT nasal spray, Place 2 sprays into the nose daily., Disp: 16 g, Rfl: 6;  lisinopril-hydrochlorothiazide (PRINZIDE,ZESTORETIC) 10-12.5 MG per tablet, Take 1 tablet by mouth daily., Disp: 90 tablet, Rfl: 1;  montelukast (SINGULAIR) 10 MG tablet, Take 1 tablet (10 mg total) by mouth at bedtime., Disp: 30 tablet, Rfl: 5 Multiple Vitamins-Minerals (CENTRUM SILVER PO), Take 1 tablet by mouth daily., Disp: , Rfl: ;  albuterol (PROVENTIL HFA;VENTOLIN HFA) 108 (90 BASE) MCG/ACT inhaler, Inhale 2 puffs into the lungs every 6 (six) hours as needed for wheezing or shortness of breath., Disp: 18 g, Rfl: 1  Allergies  Allergen Reactions  . Codeine Other (See Comments)    headache  . Penicillins Other (See Comments)    headache  . Sinus & Allergy (Pseudoephedrine) Other (See Comments)    Sinus medications do not work for her, they dry her up so badly that they give her asthma attacks.   ROS:  Denies fevers, shortness of breath, chest pain, bleeding/bruising, nausea or vomiting, skin rashes or other concerns. +intermittent loose stools, sinus pain, cough, laryngitis  PHYSICAL EXAM: BP 152/102  Pulse 72  Temp  98.2 F (36.8 C) (Oral)  Ht 5' (1.524 m)  Wt 219 lb (99.338 kg)  BMI 42.77 kg/m2  Hoarse female, in no distress. Well-appearing.  No sniffling or coughing in exam room. HEENT:  PERRL, EOMI, conjunctiva clear.  Nasal mucosa mildly edematous, no erythema or purulence.  Sinuses mildly tender x 4, slightly more tender maxillary>frontal per pt OP--moist mucus membranes, no erythema. Posterior view limited Neck: no lymphadenopathy Heart: regular rate and rhythm  Lungs: clear bilaterally Extremities: no edema Psych: normal affect, hygiene and grooming  ASSESSMENT/PLAN: 1. Essential hypertension, benign    2. URI  (upper respiratory infection)  ipratropium (ATROVENT) 0.06 % nasal spray, sulfamethoxazole-trimethoprim (BACTRIM DS,SEPTRA DS) 800-160 MG per tablet  3. Asthma     URI--no evidence to suggest bacterial infection at this time, although would treat with antibiotics if symptoms/discolored mucus doesn't improve over the next 3-5 days.  I recommend using mucinex to loosen up the phlegm, sinus rinses or neti-pot to help with sinus pressure/congestion.  Avoid decongestants (due to high blood pressure, and you don't tolerate them).  I am prescribing Atrovent nasal spray to help with the runny nose (given that she doesn't tolerate other symptomatic meds, and chief complaint is raw nose from the drainage and use of tissues).  Continue singulair for asthma--likely is the reason that she hasn't had a huge flare in wheezing with this illness.  Continue albuterol prn.  Start the Bactrim antibiotic in 3-5 days if you aren't feeling better and mucus is still thick and discolored.  Counseled re: risks/side effects of meds

## 2012-12-23 NOTE — Patient Instructions (Addendum)
  URI--no evidence to suggest bacterial infection at this time, although would treat with antibiotics if symptoms/discolored mucus doesn't improve over the next 3-5 days.  I recommend using mucinex to loosen up the phlegm, sinus rinses or neti-pot to help with sinus pressure/congestion.  Avoid decongestants (due to high blood pressure, and you don't tolerate them).  I am prescribing Atrovent nasal spray to help with the runny nose.  Start the Bactrim antibiotic in 3-5 days if you aren't feeling better and mucus is still thick and discolored.  Please continue to monitor your blood pressure regularly.  It was high today.  Make sure, once feeling better, that it comes back down (to <135/85)

## 2013-02-26 ENCOUNTER — Ambulatory Visit (INDEPENDENT_AMBULATORY_CARE_PROVIDER_SITE_OTHER): Payer: Commercial Managed Care - PPO | Admitting: Family Medicine

## 2013-02-26 ENCOUNTER — Encounter: Payer: Self-pay | Admitting: Family Medicine

## 2013-02-26 VITALS — BP 140/102 | HR 72 | Ht 61.0 in | Wt 217.0 lb

## 2013-02-26 DIAGNOSIS — I10 Essential (primary) hypertension: Secondary | ICD-10-CM

## 2013-02-26 DIAGNOSIS — Z0000 Encounter for general adult medical examination without abnormal findings: Secondary | ICD-10-CM

## 2013-02-26 DIAGNOSIS — J309 Allergic rhinitis, unspecified: Secondary | ICD-10-CM

## 2013-02-26 DIAGNOSIS — J45909 Unspecified asthma, uncomplicated: Secondary | ICD-10-CM

## 2013-02-26 DIAGNOSIS — R5381 Other malaise: Secondary | ICD-10-CM

## 2013-02-26 DIAGNOSIS — R5383 Other fatigue: Secondary | ICD-10-CM

## 2013-02-26 LAB — POCT URINALYSIS DIPSTICK
Glucose, UA: NEGATIVE
Ketones, POC UA: NEGATIVE
Spec Grav, UA: 1.02
Urobilinogen, UA: NEGATIVE

## 2013-02-26 MED ORDER — LISINOPRIL-HYDROCHLOROTHIAZIDE 20-25 MG PO TABS
1.0000 | ORAL_TABLET | Freq: Every day | ORAL | Status: DC
Start: 2013-02-26 — End: 2013-04-30

## 2013-02-26 NOTE — Progress Notes (Signed)
Chief Complaint  Patient presents with  . Annual Exam    CPE without pap, might need pelvic. Patient had coffee with milk this am. No major concerns today. UA  showed trace blood and 1+ leuks, pt asymptomatic.   Natasha Bauer is a 57 y.o. female who presents for a complete physical.  She has the following concerns:  Seen recently with URI, seemed to get better, but a couple of weeks later symptoms recurred, worse.  She took the antibiotics that were prescribed, and got completely better.  Allergies are currently doing well.  She is no longer using the Singulair or Flonase.  She hasn't needed any albuterol.  Hypertension follow-up:  Blood pressures elsewhere are 135/92 (this morning), usually running 140/90.  Denies dizziness, headaches, chest pain.  Denies side effects of medications.  Immunization History  Administered Date(s) Administered  . Influenza Split 08/21/2012  . Pneumococcal Polysaccharide 12/28/2010  . Tdap 04/09/2012   Last Pap smear:  N/a (s/p hysterectomy for benign reasons) Last mammogram: 2 years ago Last colonoscopy: 08/2012, showed sessile polyp Last DEXA: never Dentist: yearly Ophtho: 6 months ago Exercise: limited with cold weather.  Usually walks around the track in the warm weather (1 mile) Last labs done 12/2010--chole 219, HDL 105, TG 86, LDL 97.  Normal CBC, c-met, TSH 0.59  Past Medical History  Diagnosis Date  . Hypertension age 30  . Asthma   . Seasonal allergies   . Colon polyp     hyperplastic in 2004 and 2013   . Tenosynovitis, de Quervain     Dr. Amanda Pea, resolved    Past Surgical History  Procedure Laterality Date  . Total abdominal hysterectomy w/ bilateral salpingoophorectomy  05/2003    fibroids  . Colonoscopy  08/20/12    Dr. Elnoria Howard (hyperplastic polyp)    History   Social History  . Marital Status: Married    Spouse Name: N/A    Number of Children: N/A  . Years of Education: N/A   Occupational History  . food Market researcher,  and private caregiver [employer]   Social History Main Topics  . Smoking status: Former Smoker -- 0.50 packs/day for 8 years    Types: Cigarettes    Quit date: 12/12/1999  . Smokeless tobacco: Never Used  . Alcohol Use: No  . Drug Use: No  . Sexually Active: Yes -- Female partner(s)    Birth Control/ Protection: Surgical   Other Topics Concern  . Not on file   Social History Narrative   Lives with her husband. No children, 1 dog    Family History  Problem Relation Age of Onset  . Hypertension Father   . Cancer Mother 63    colon cancer  . Diabetes Mother   . Arthritis Sister     rheumatoid arthritis  . Hypertension Sister   . Arthritis Brother     rheumatoid arthritis  . Heart disease Neg Hx     Current outpatient prescriptions:aspirin 81 MG tablet, Take 81 mg by mouth daily., Disp: , Rfl: ;  Multiple Vitamins-Minerals (CENTRUM SILVER PO), Take 1 tablet by mouth daily., Disp: , Rfl: ;  albuterol (PROVENTIL HFA;VENTOLIN HFA) 108 (90 BASE) MCG/ACT inhaler, Inhale 2 puffs into the lungs every 6 (six) hours as needed for wheezing or shortness of breath., Disp: 18 g, Rfl: 1 fluticasone (FLONASE) 50 MCG/ACT nasal spray, Place 2 sprays into the nose daily., Disp: 16 g, Rfl: 6;  ipratropium (ATROVENT) 0.06 % nasal spray, Place 2  sprays into the nose 4 (four) times daily. Use as needed for runny nose, Disp: 15 mL, Rfl: 12;  lisinopril-hydrochlorothiazide (PRINZIDE,ZESTORETIC) 20-25 MG per tablet, Take 1 tablet by mouth daily., Disp: 90 tablet, Rfl: 0 montelukast (SINGULAIR) 10 MG tablet, Take 1 tablet (10 mg total) by mouth at bedtime., Disp: 30 tablet, Rfl: 5  Allergies  Allergen Reactions  . Codeine Other (See Comments)    headache  . Penicillins Other (See Comments)    headache  . Sinus & Allergy (Pseudoephedrine) Other (See Comments)    Sinus medications do not work for her, they dry her up so badly that they give her asthma attacks.   ROS:  The patient denies anorexia, fever,  weight changes, headaches,  vision changes, decreased hearing, ear pain, sore throat, breast concerns, chest pain, palpitations, dizziness, syncope, dyspnea on exertion, cough, swelling, nausea, vomiting, diarrhea, constipation, abdominal pain, melena, hematochezia, indigestion/heartburn, hematuria, incontinence, dysuria, vaginal bleeding, discharge, odor or itch, genital lesions, joint pains, numbness, tingling, weakness, tremor, suspicious skin lesions, depression, anxiety, abnormal bleeding/bruising, or enlarged lymph nodes.  PHYSICAL EXAM: BP 140/102  Pulse 72  Ht 5\' 1"  (1.549 m)  Wt 217 lb (98.431 kg)  BMI 41.02 kg/m2  General Appearance:    Alert, cooperative, no distress, appears stated age  Head:    Normocephalic, without obvious abnormality, atraumatic  Eyes:    PERRL, conjunctiva/corneas clear, EOM's intact, fundi    benign  Ears:    Normal TM's and external ear canals  Nose:   Nares normal, mucosa normal, no drainage or sinus   tenderness  Throat:   Lips, mucosa, and tongue normal; teeth and gums normal  Neck:   Supple, no lymphadenopathy;  thyroid:  no   enlargement/tenderness/nodules; no carotid   bruit or JVD  Back:    Spine nontender, no curvature, ROM normal, no CVA     tenderness  Lungs:     Clear to auscultation bilaterally without wheezes, rales or     ronchi; respirations unlabored  Chest Wall:    No tenderness or deformity   Heart:    Regular rate and rhythm, S1 and S2 normal, no murmur, rub   or gallop  Breast Exam:    No tenderness, masses, or nipple discharge or inversion.      No axillary lymphadenopathy  Abdomen:     Soft, non-tender, nondistended, normoactive bowel sounds,    no masses, no hepatosplenomegaly  Genitalia:    Deferred by pt.  Rectal:    Deferred by pt  Extremities:   No clubbing, cyanosis or edema  Pulses:   2+ and symmetric all extremities  Skin:   Skin color, texture, turgor normal, no rashes or lesions  Lymph nodes:   Cervical,  supraclavicular, and axillary nodes normal  Neurologic:   CNII-XII intact, normal strength, sensation and gait; reflexes 2+ and symmetric throughout          Psych:   Normal mood, affect, hygiene and grooming.   ASSESSMENT/PLAN:  Routine general medical examination at a health care facility - Plan: Visual acuity screening, POCT Urinalysis Dipstick  Essential hypertension, benign - suboptimaly controlled.  Increase lisinopril HCT dose - Plan: Lipid panel, Comprehensive metabolic panel, lisinopril-hydrochlorothiazide (PRINZIDE,ZESTORETIC) 20-25 MG per tablet  Allergic rhinitis, cause unspecified  Asthma  Other malaise and fatigue - Plan: CBC with Differential, Vitamin D 25 hydroxy, TSH  Return fasting c-met, lipid, TSH, CBC, Vitamin D  HTN--suboptimally controlled. Increase dose of med.  Return 3-4 weeks for labs,  2 months f/u HTN OV  Low sodium diet Exercise daily, weight loss encouraged.  Schedule mammogram   Allergies/asthma--start up Singulair soon, as spring allergies should start soon, and take through the spring season.  Start back on flonase if singulair isn't adequately treating allergies.  Discussed monthly self breast exams and yearly mammograms after the age of 11; at least 30 minutes of aerobic activity at least 5 days/week; proper sunscreen use reviewed; healthy diet, including goals of calcium and vitamin D intake and alcohol recommendations (less than or equal to 1 drink/day) reviewed; regular seatbelt use; changing batteries in smoke detectors.  Immunization recommendations discussed, UTD--consider zostavax when covered by insurance.  Colonoscopy recommendations reviewed--UTD, due again 2023

## 2013-02-26 NOTE — Patient Instructions (Addendum)
We are increasing your blood pressure medication dose.  Make sure to get some potassium in your diet daily (ie 1/2 banana or orange juice daily). Return in 3-4 week for fasting bloodwork, sooner if having muscle cramps/spasms or other problems.  Continue to check BP daily, write down numbers, and bring list to your next visit in 2 months. You may return sooner if blood pressure are running too high or too low.   Allergies/asthma--start up Singulair soon, as spring allergies should start soon, and take through the spring season.  Start back on flonase if singulair isn't adequately treating allergies.  HEALTH MAINTENANCE RECOMMENDATIONS:  It is recommended that you get at least 30 minutes of aerobic exercise at least 5 days/week (for weight loss, you may need as much as 60-90 minutes). This can be any activity that gets your heart rate up. This can be divided in 10-15 minute intervals if needed, but try and build up your endurance at least once a week.  Weight bearing exercise is also recommended twice weekly.  Eat a healthy diet with lots of vegetables, fruits and fiber.  "Colorful" foods have a lot of vitamins (ie green vegetables, tomatoes, red peppers, etc).  Limit sweet tea, regular sodas and alcoholic beverages, all of which has a lot of calories and sugar.  Up to 1 alcoholic drink daily may be beneficial for women (unless trying to lose weight, watch sugars).  Drink a lot of water.  Calcium recommendations are 1200-1500 mg daily (1500 mg for postmenopausal women or women without ovaries), and vitamin D 1000 IU daily.  This should be obtained from diet and/or supplements (vitamins), and calcium should not be taken all at once, but in divided doses.  Monthly self breast exams and yearly mammograms for women over the age of 27 is recommended.  Sunscreen of at least SPF 30 should be used on all sun-exposed parts of the skin when outside between the hours of 10 am and 4 pm (not just when at beach  or pool, but even with exercise, golf, tennis, and yard work!)  Use a sunscreen that says "broad spectrum" so it covers both UVA and UVB rays, and make sure to reapply every 1-2 hours.  Remember to change the batteries in your smoke detectors when changing your clock times in the spring and fall.  Use your seat belt every time you are in a car, and please drive safely and not be distracted with cell phones and texting while driving.  Sodium-Controlled Diet Sodium is a mineral. It is found in many foods. Sodium may be found naturally or added during the making of a food. The most common form of sodium is salt, which is made up of sodium and chloride. Reducing your sodium intake involves changing your eating habits. The following guidelines will help you reduce the sodium in your diet:  Stop using the salt shaker.  Use salt sparingly in cooking and baking.  Substitute with sodium-free seasonings and spices.  Do not use a salt substitute (potassium chloride) without your caregiver's permission.  Include a variety of fresh, unprocessed foods in your diet.  Limit the use of processed and convenience foods that are high in sodium. USE THE FOLLOWING FOODS SPARINGLY: Breads/Starches  Commercial bread stuffing, commercial pancake or waffle mixes, coating mixes. Waffles. Croutons. Prepared (boxed or frozen) potato, rice, or noodle mixes that contain salt or sodium. Salted [place] fries or hash browns. Salted popcorn, breads, crackers, chips, or snack foods. Vegetables  Vegetables canned  with salt or prepared in cream, butter, or cheese sauces. Sauerkraut. Tomato or vegetable juices canned with salt.  Fresh vegetables are allowed if rinsed thoroughly. Fruit  Fruit is okay to eat. Meat and Meat Substitutes  Salted or smoked meats, such as bacon or Canadian bacon, chipped or corned beef, hot dogs, salt pork, luncheon meats, pastrami, ham, or sausage. Canned or smoked fish, poultry, or meat.  Processed cheese or cheese spreads, blue or Roquefort cheese. Battered or frozen fish products. Prepared spaghetti sauce. Baked beans. Reuben sandwiches. Salted nuts. Caviar. Milk  Limit buttermilk to 1 cup per week. Soups and Combination Foods  Bouillon cubes, canned or dried soups, broth, consomm. Convenience (frozen or packaged) dinners with more than 600 mg sodium. Pot pies, pizza, Asian food, fast food cheeseburgers, and specialty sandwiches. Desserts and Sweets  Regular (salted) desserts, pie, commercial fruit snack pies, commercial snack cakes, canned puddings.  Eat desserts and sweets in moderation. Fats and Oils  Gravy mixes or canned gravy. No more than 1 to 2 tbs of salad dressing. Chip dips.  Eat fats and oils in moderation. Beverages  See those listed under the vegetables and milk groups. Condiments  Ketchup, mustard, meat sauces, salsa, regular (salted) and lite soy sauce or mustard. Dill pickles, olives, meat tenderizer. Prepared horseradish or pickle relish. Dutch-processed cocoa. Baking powder or baking soda used medicinally. Worcestershire sauce. "Light" salt. Salt substitute, unless approved by your caregiver. Document Released: 05/19/2002 Document Revised: 02/19/2012 Document Reviewed: 12/20/2009 [facility] Patient Information 2013 [place], [place].

## 2013-03-26 ENCOUNTER — Other Ambulatory Visit: Payer: Commercial Managed Care - PPO

## 2013-03-26 DIAGNOSIS — R5383 Other fatigue: Secondary | ICD-10-CM

## 2013-03-26 DIAGNOSIS — I10 Essential (primary) hypertension: Secondary | ICD-10-CM

## 2013-03-26 DIAGNOSIS — R5381 Other malaise: Secondary | ICD-10-CM

## 2013-03-26 LAB — CBC WITH DIFFERENTIAL/PLATELET
Eosinophils Absolute: 0.2 10*3/uL (ref 0.0–0.7)
Eosinophils Relative: 3 % (ref 0–5)
HCT: 40.8 % (ref 36.0–46.0)
Hemoglobin: 13.7 g/dL (ref 12.0–15.0)
Lymphocytes Relative: 43 % (ref 12–46)
Lymphs Abs: 2.1 10*3/uL (ref 0.7–4.0)
MCH: 25.9 pg — ABNORMAL LOW (ref 26.0–34.0)
MCV: 77.1 fL — ABNORMAL LOW (ref 78.0–100.0)
Monocytes Absolute: 0.4 10*3/uL (ref 0.1–1.0)
Monocytes Relative: 8 % (ref 3–12)
Platelets: 205 10*3/uL (ref 150–400)
RBC: 5.29 MIL/uL — ABNORMAL HIGH (ref 3.87–5.11)
WBC: 5 10*3/uL (ref 4.0–10.5)

## 2013-03-26 LAB — COMPREHENSIVE METABOLIC PANEL
ALT: 17 U/L (ref 0–35)
AST: 17 U/L (ref 0–37)
Alkaline Phosphatase: 52 U/L (ref 39–117)
Calcium: 9.8 mg/dL (ref 8.4–10.5)
Glucose, Bld: 101 mg/dL — ABNORMAL HIGH (ref 70–99)
Potassium: 4 mEq/L (ref 3.5–5.3)
Sodium: 142 mEq/L (ref 135–145)
Total Bilirubin: 0.3 mg/dL (ref 0.3–1.2)

## 2013-03-26 LAB — LIPID PANEL
HDL: 92 mg/dL (ref >39)
Total CHOL/HDL Ratio: 2.3 Ratio
VLDL: 15 mg/dL (ref 0–40)

## 2013-03-27 LAB — VITAMIN D 25 HYDROXY (VIT D DEFICIENCY, FRACTURES): Vit D, 25-Hydroxy: 35 ng/mL (ref 30–89)

## 2013-04-30 ENCOUNTER — Encounter: Payer: Self-pay | Admitting: Family Medicine

## 2013-04-30 ENCOUNTER — Ambulatory Visit (INDEPENDENT_AMBULATORY_CARE_PROVIDER_SITE_OTHER): Payer: Commercial Managed Care - PPO | Admitting: Family Medicine

## 2013-04-30 VITALS — BP 150/90 | HR 80 | Ht 61.0 in | Wt 210.0 lb

## 2013-04-30 DIAGNOSIS — J45909 Unspecified asthma, uncomplicated: Secondary | ICD-10-CM

## 2013-04-30 DIAGNOSIS — R7301 Impaired fasting glucose: Secondary | ICD-10-CM

## 2013-04-30 DIAGNOSIS — I10 Essential (primary) hypertension: Secondary | ICD-10-CM

## 2013-04-30 DIAGNOSIS — E669 Obesity, unspecified: Secondary | ICD-10-CM

## 2013-04-30 DIAGNOSIS — J309 Allergic rhinitis, unspecified: Secondary | ICD-10-CM

## 2013-04-30 MED ORDER — MONTELUKAST SODIUM 10 MG PO TABS: 10.0000 mg | ORAL_TABLET | Freq: Every day | ORAL | Status: DC

## 2013-04-30 MED ORDER — LISINOPRIL-HYDROCHLOROTHIAZIDE 20-25 MG PO TABS: 1.0000 | ORAL_TABLET | Freq: Every day | ORAL | Status: DC

## 2013-04-30 NOTE — Patient Instructions (Addendum)
Try and get at least 30 minutes of exercise every day, even if just in 10-15 minute intervals.  Continue your current diet, and weight loss. Continue current medications.  Get your husband to quit smoking, or at least to NOT smoke around you--it is bad for your asthma.  Make sure to bring your monitor and list of blood pressures to your next visit.

## 2013-04-30 NOTE — Progress Notes (Signed)
Chief Complaint  Patient presents with  . Hypertension    2 month follow up. Forgot list of bp's at home, she does remember some of the readings.    Patient forgot to bring her BP readings today, but recalls they are running 111-135/68-87.  Dose of medication was increased at her CPE 2 months ago.  Denies headaches, dizziness, chest pain, cough or other side effects of medication. She cut back on salt in the diet, drinking more water, less soda, eating healthier.  She hasn't started any exercise yet. She had labs done last month (after dose increase).  Asthma and allergies: She Is back on Singulair, only needed Flonase for a couple of days.  Allergies and asthma are controlled.  Only needs rescue inhaler about twice a year, not recently. Has passive tobacco exposure from her husband.  Past Medical History  Diagnosis Date  . Hypertension age 57  . Asthma   . Seasonal allergies   . Colon polyp     hyperplastic in 2004 and 2013   . Tenosynovitis, de Quervain     Dr. Amanda Pea, resolved   Past Surgical History  Procedure Laterality Date  . Total abdominal hysterectomy w/ bilateral salpingoophorectomy  05/2003    fibroids  . Colonoscopy  08/20/12    Dr. Elnoria Howard (hyperplastic polyp)   History   Social History  . Marital Status: Married    Spouse Name: N/A    Number of Children: N/A  . Years of Education: N/A   Occupational History  . 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 Smoking status: Former Smoker -- 0.50 packs/day for 8 years    Types: Cigarettes    Quit date: 12/12/1999  . Smokeless tobacco: Never Used  . Alcohol Use: No  . Drug Use: No  . Sexually Active: Yes -- Female partner(s)    Birth Control/ Protection: Surgical   Other Topics Concern  . Not on file   Social History Narrative   Lives with her husband. No children, 1 dog    Current outpatient prescriptions:aspirin 81 MG tablet, Take 81 mg by mouth daily., Disp: , Rfl: ;   lisinopril-hydrochlorothiazide (PRINZIDE,ZESTORETIC) 20-25 MG per tablet, Take 1 tablet by mouth daily., Disp: 90 tablet, Rfl: 0;  montelukast (SINGULAIR) 10 MG tablet, Take 1 tablet (10 mg total) by mouth at bedtime., Disp: 30 tablet, Rfl: 5;  Multiple Vitamins-Minerals (CENTRUM SILVER PO), Take 1 tablet by mouth daily., Disp: , Rfl:  albuterol (PROVENTIL HFA;VENTOLIN HFA) 108 (90 BASE) MCG/ACT inhaler, Inhale 2 puffs into the lungs every 6 (six) hours as needed for wheezing or shortness of breath., Disp: 18 g, Rfl: 1;  fluticasone (FLONASE) 50 MCG/ACT nasal spray, Place 2 sprays into the nose daily., Disp: 16 g, Rfl: 6;  ipratropium (ATROVENT) 0.06 % nasal spray, Place 2 sprays into the nose 4 (four) times daily. Use as needed for runny nose, Disp: 15 mL, Rfl: 12  Allergies  Allergen Reactions  . Codeine Other (See Comments)    headache  . Penicillins Other (See Comments)    headache  . Sinus & Allergy (Pseudoephedrine) Other (See Comments)    Sinus medications do not work for her, they dry her up so badly that they give her asthma attacks.   ROS:  Denies fevers, cough, shortness of breath, URI symptoms, allergies are controlled.  Denies nausea, vomiting, diarrhea, cough, edema or other complaints.  "I'm feeling good!"   PHYSICAL EXAM: BP 150/90  Pulse 80  Ht 5\' 1"  (1.549 m)  Wt 210 lb (95.255 kg)  BMI 39.7 kg/m2 154/92 on repeat by MD.  Pt is excitable today.  Smells of cigarette smoke (from her husband who drove her here) Well developed female in no distress Neck: no lymphadenopathy or mass Heart: regular rate and rhythm without murmur Lungs: clear bilaterally without wheezes Abdomen: soft, nontender, no mass Extremities: no edema Psych: normal mood, affect, hygiene and grooming Neuro: alert and oriented. Cranial nerves intact, normal gait   Lab Results  Component Value Date   WBC 5.0 03/26/2013   HGB 13.7 03/26/2013   HCT 40.8 03/26/2013   MCV 77.1* 03/26/2013   PLT 205  03/26/2013     Chemistry      Component Value Date/Time   NA 142 03/26/2013 0839   K 4.0 03/26/2013 0839   CL 100 03/26/2013 0839   CO2 26 03/26/2013 0839   BUN 16 03/26/2013 0839   CREATININE 0.74 03/26/2013 0839   CREATININE 0.88 08/01/2012 0549      Component Value Date/Time   CALCIUM 9.8 03/26/2013 0839   ALKPHOS 52 03/26/2013 0839   AST 17 03/26/2013 0839   ALT 17 03/26/2013 0839   BILITOT 0.3 03/26/2013 0839     Glucose 101  Lab Results  Component Value Date   CHOL 213* 03/26/2013   HDL 92 03/26/2013   LDLCALC 106* 03/26/2013   TRIG 76 03/26/2013   CHOLHDL 2.3 03/26/2013   ASSESSMENT/PLAN:  Essential hypertension, benign - improved per pt's recall of home numbers; elevated here today.  continue current meds, weight loss, low sodium diet.  daily exercise recommended - Plan: lisinopril-hydrochlorothiazide (PRINZIDE,ZESTORETIC) 20-25 MG per tablet  Obesity, Class II, BMI 35-39.9 - counseled re: diet, exercise, portions and available weight loss options  Impaired fasting glucose - mild; counseled re: exercise, cutting back on sweets and carbs, need for ongoing weight loss  Asthma - well controlled - Plan: montelukast (SINGULAIR) 10 MG tablet  Allergic rhinitis, cause unspecified - controlled  HTN--improved control. Continue current meds.  Continue monitoring BP's at home, bring list to f/u (and monitor, if desires recheck).  Mild impaired fasting glucose.  Continue appropriate diet.  Encouraged daily exercise and continued weight loss.  Lipids are excellent. Labs reviewed with patient. Counseled re: diet, exercise, weight loss. Continue to monitor BP at home.  Bring monitor and list to next visit in 6 months, return sooner if BP's high at home.  Asthma--well controlled Allergies--controlled. Educated about proper use of flonase (not prn).  F/u 6 months for med check

## 2013-11-05 ENCOUNTER — Ambulatory Visit (INDEPENDENT_AMBULATORY_CARE_PROVIDER_SITE_OTHER): Payer: Commercial Managed Care - PPO | Admitting: Family Medicine

## 2013-11-05 ENCOUNTER — Encounter: Payer: Self-pay | Admitting: Family Medicine

## 2013-11-05 VITALS — BP 132/78 | HR 72 | Ht 61.0 in | Wt 206.0 lb

## 2013-11-05 DIAGNOSIS — J45909 Unspecified asthma, uncomplicated: Secondary | ICD-10-CM

## 2013-11-05 DIAGNOSIS — I10 Essential (primary) hypertension: Secondary | ICD-10-CM

## 2013-11-05 DIAGNOSIS — R7301 Impaired fasting glucose: Secondary | ICD-10-CM

## 2013-11-05 DIAGNOSIS — J309 Allergic rhinitis, unspecified: Secondary | ICD-10-CM

## 2013-11-05 DIAGNOSIS — E669 Obesity, unspecified: Secondary | ICD-10-CM

## 2013-11-05 LAB — POCT GLYCOSYLATED HEMOGLOBIN (HGB A1C): Hemoglobin A1C: 5.3

## 2013-11-05 MED ORDER — LISINOPRIL-HYDROCHLOROTHIAZIDE 20-25 MG PO TABS: 1.0000 | ORAL_TABLET | Freq: Every day | ORAL | Status: DC

## 2013-11-05 MED ORDER — MONTELUKAST SODIUM 10 MG PO TABS: 10.0000 mg | ORAL_TABLET | Freq: Every day | ORAL | Status: DC

## 2013-11-05 MED ORDER — ALBUTEROL SULFATE HFA 108 (90 BASE) MCG/ACT IN AERS: 2.0000 | INHALATION_SPRAY | Freq: Four times a day (QID) | RESPIRATORY_TRACT | Status: DC | PRN

## 2013-11-05 NOTE — Patient Instructions (Signed)
Continue to eat healthy, exercise daily. Continue your current medications and periodically checking your blood pressures at home.  Goal is <135/85.  Try and add some weight-bearing exercise to your routine twice weekly (at least).  Keep benadryl on hand.  If you are going to try and eat nuts again, have someone else around in case you have a reaction.  If you do have another reaction, I recommend seeing allergist for testing.

## 2013-11-05 NOTE — Progress Notes (Signed)
Chief Complaint  Patient presents with  . Hypertension    fasting med check.   . Asthma   Hypertension follow-up:  Blood pressures elsewhere are 112/76-142/92, usually 125-130's/80's.  Denies dizziness, headaches, chest pain, edema.  Denies side effects of medications.  She is walking 45 minutes daily (about 20-25 mins twice daily), plus some exercises in the house; no weight-bearing activity. She has been reading a lot about healthy eating, has some questions about sugars in fruits.  Asthma/allergies:  She has been doing very well, not needing rescue inhaler at all.  Taking Singulair daily.  Has not needed Flonase in quite a while, but would like renewal of rx (expired).  She got her flu shot at work last month.  She had allergic reaction the other day after eating a candy bar--started itching all over, broke out into hives, eyes itched. It started with her eyes itching, and she thought it was just her regular allergies, but then she started itching everywhere and rash and splotchiness started. She took benadryl and symptoms resolved.  She had eaten a Snickers with almonds, and has avoided almonds since then, but tolerated other nuts (had pecans). She isn't sure if it could have been something related to her dog, or a food allergy.  Has never had problems in past with nuts, eats frequently.  Past Medical History  Diagnosis Date  . Hypertension age 57  . Asthma   . Seasonal allergies   . Colon polyp     hyperplastic in 2004 and 2013   . Tenosynovitis, de Quervain     Dr. Amanda Pea, resolved   Past Surgical History  Procedure Laterality Date  . Total abdominal hysterectomy w/ bilateral salpingoophorectomy  05/2003    fibroids  . Colonoscopy  08/20/12    Dr. Elnoria Howard (hyperplastic polyp)   History   Social History  . Marital Status: Married    Spouse Name: N/A    Number of Children: N/A  . Years of Education: N/A   Occupational History  . food Market researcher, and private caregiver [employer]   Social History Main Topics  . Smoking status: Former Smoker -- 0.50 packs/day for 8 years    Types: Cigarettes    Quit date: 12/12/1999  . Smokeless tobacco: Never Used  . Alcohol Use: No  . Drug Use: No  . Sexual Activity: Yes    Partners: Male    Birth Control/ Protection: Surgical   Other Topics Concern  . Not on file   Social History Narrative   Lives with her husband. No children, 1 dog.  Works as a Microbiologist and a Radiographer, therapeutic at [employer]   Family History  Problem Relation Age of Onset  . Hypertension Father   . Cancer Mother 80    colon cancer  . Diabetes Mother   . Arthritis Sister     rheumatoid arthritis  . Hypertension Sister   . Arthritis Brother     rheumatoid arthritis  . Heart disease Neg Hx    Current outpatient prescriptions:aspirin 81 MG tablet, Take 81 mg by mouth daily., Disp: , Rfl: ;  lisinopril-hydrochlorothiazide (PRINZIDE,ZESTORETIC) 20-25 MG per tablet, Take 1 tablet by mouth daily., Disp: 90 tablet, Rfl: 1;  montelukast (SINGULAIR) 10 MG tablet, Take 1 tablet (10 mg total) by mouth at bedtime., Disp: 90 tablet, Rfl: 1;  Multiple Vitamins-Minerals (CENTRUM SILVER PO), Take 1 tablet by mouth daily., Disp: , Rfl:  albuterol (PROVENTIL HFA;VENTOLIN HFA) 108 (90 BASE) MCG/ACT inhaler,  Inhale 2 puffs into the lungs every 6 (six) hours as needed for wheezing or shortness of breath., Disp: 18 g, Rfl: 1;  fluticasone (FLONASE) 50 MCG/ACT nasal spray, Place 2 sprays into the nose daily., Disp: 16 g, Rfl: 6;  ipratropium (ATROVENT) 0.06 % nasal spray, Place 2 sprays into the nose 4 (four) times daily. Use as needed for runny nose, Disp: 15 mL, Rfl: 12 (not using atrovent, albuterol or Flonase currently)  Allergies  Allergen Reactions  . Codeine Other (See Comments)    headache  . Penicillins Other (See Comments)    headache  . Sinus & Allergy [Pseudoephedrine] Other (See Comments)    Sinus medications do not work for her, they dry her up  so badly that they give her asthma attacks.   ROS: +intentional weight loss.  No headaches, dizziness, chest pain, palpitations, edema, muscle cramps.  No fevers, URI symptoms, significant current allergy symptoms.  Denies cough, shortness of breath.  No bleeding/bruising, GI complaints or other concerns.  PHYSICAL EXAM: BP 132/78  Pulse 72  Ht 5\' 1"  (1.549 m)  Wt 206 lb (93.441 kg)  BMI 38.94 kg/m2 Very pleasant, obese female in no distress HEENT:  PERRL, EOMI, conjunctiva clear.  TM's and EAC's normal.  Nasal mucosa mildly edematous, no erythema or purulence.  OP clear Neck: no lymphadenopathy, thyromegaly or carotid bruit Heart: regular rate and rhythm without murmur, rub, gallop Lungs: clear bilaterally Abdomen: soft, nontender, no mass Extremities: no edema, 2+ pulse Neuro: alert and oriented, cranial nerves intact.  Normal gait, strength Psych: normal mood, affect, hygiene and grooming  Fasting blood sugar 88 HbA1c 5.3  ASSESSMENT/PLAN:  Essential hypertension, benign - controlled.  continue low sodium diet (careful with nuts); continue exercise, add weight-bearing to UE's.  continue weight loss and current meds - Plan: lisinopril-hydrochlorothiazide (PRINZIDE,ZESTORETIC) 20-25 MG per tablet  Asthma - well controlled.  refilled albuterol since her rx has expired. Advised to f/u if needing albuterol 2x/week - Plan: montelukast (SINGULAIR) 10 MG tablet, albuterol (PROVENTIL HFA;VENTOLIN HFA) 108 (90 BASE) MCG/ACT inhaler  Allergic rhinitis, cause unspecified - controlled.  restart Flonase if/when needed in the Spring. - Plan: montelukast (SINGULAIR) 10 MG tablet  Obesity, Class II, BMI 35-39.9 - counseled extensively re: diet today--limit carbs, increase healthy vegetables, fruits, discussed portion sizes and healthy snacks  IFG (impaired fasting glucose) - mildly elevated in past.  normal now.  healthy diet, exercise and weight loss encouraged/reviewed - Plan: Glucose (CBG), HgB  A1c  Allergic reaction--?food vs other etiology.  Keep benadryl on hand.  If recurrent reactions, can refer to allergist for testing.  Osteoporosis counseling--encouraged to add weight-bearing exercising at least twice per week. Discussed calcium and vitamin D requirements and supplements.  30 minute visit, more than 1/2 spent counseling.   F/u 6 months for CPE, sooner prn

## 2014-01-03 ENCOUNTER — Emergency Department (HOSPITAL_COMMUNITY)
Admission: EM | Admit: 2014-01-03 | Discharge: 2014-01-03 | Disposition: A | Discharge status: Home / Self Care | Payer: 59 | Source: Home / Self Care | Attending: Emergency Medicine | Admitting: Emergency Medicine

## 2014-01-03 ENCOUNTER — Encounter (HOSPITAL_COMMUNITY): Payer: Self-pay | Admitting: Emergency Medicine

## 2014-01-03 DIAGNOSIS — J069 Acute upper respiratory infection, unspecified: Secondary | ICD-10-CM

## 2014-01-03 MED ORDER — IPRATROPIUM BROMIDE 0.06 % NA SOLN: 2.0000 | Freq: Four times a day (QID) | NASAL | Status: DC

## 2014-01-03 MED ORDER — PREDNISONE 20 MG PO TABS: 20.0000 mg | ORAL_TABLET | Freq: Two times a day (BID) | ORAL | Status: DC

## 2014-01-03 MED ORDER — BENZONATATE 200 MG PO CAPS: 200.0000 mg | ORAL_CAPSULE | Freq: Three times a day (TID) | ORAL | Status: DC | PRN

## 2014-01-03 NOTE — Discharge Instructions (Signed)

## 2014-01-03 NOTE — ED Notes (Signed)
Pt c/o cold sxs onset Wednesday Sxs include: cough, congestion, bilateral ear fullness Hx of asthma... Taking her ventolin  She is alert w/no signs of acute distress.

## 2014-01-03 NOTE — ED Provider Notes (Signed)
  Chief Complaint   Chief Complaint  Patient presents with  . URI    History of Present Illness   Natasha Bauer is a 58 year old female who has had a four-day history of nasal congestion with yellow-green rhinorrhea, headache, sinus pressure, ear congestion, dry cough, chest tightness, chills, and sore throat. She has a history of intermittent asthma and uses albuterol as needed. She denies any fever or GI symptoms. She has had no specific exposures.  Review of Systems   Other than as noted above, the patient denies any of the following symptoms: Systemic:  No fevers, chills, sweats, or myalgias. Eye:  No redness or discharge. ENT:  No ear pain, headache, nasal congestion, drainage, sinus pressure, or sore throat. Neck:  No neck pain, stiffness, or swollen glands. Lungs:  No cough, sputum production, hemoptysis, wheezing, chest tightness, shortness of breath or chest pain. GI:  No abdominal pain, nausea, vomiting or diarrhea.  PMFSH   Past medical history, family history, social history, meds, and allergies were reviewed. She's allergic to penicillin. She takes lisinopril/HCTZ, Singulair, albuterol. She has a history of high blood pressure, asthma, and allergies.  Physical exam   Vital signs:  BP 161/95  Pulse 94  Temp(Src) 98 F (36.7 C) (Oral)  Resp 16  SpO2 99% General:  Alert and oriented.  In no distress.  Skin warm and dry. Eye:  No conjunctival injection or drainage. Lids were normal. ENT:  TMs and canals were normal, without erythema or inflammation.  Nasal mucosa was clear and uncongested, without drainage.  Mucous membranes were moist.  Pharynx was clear with no exudate or drainage.  There were no oral ulcerations or lesions. Neck:  Supple, no adenopathy, tenderness or mass. Lungs:  No respiratory distress.  Lungs were clear to auscultation, without wheezes, rales or rhonchi.  Breath sounds were clear and equal bilaterally.  Heart:  Regular rhythm, without  gallops, murmers or rubs. Skin:  Clear, warm, and dry, without rash or lesions.  Assessment     The encounter diagnosis was Viral URI.  No indication for antibiotics.  Plan    1.  Meds:  The following meds were prescribed:   Discharge Medication List as of 01/03/2014  9:27 AM    START taking these medications   Details  benzonatate (TESSALON) 200 MG capsule Take 1 capsule (200 mg total) by mouth 3 (three) times daily as needed for cough., Starting 01/03/2014, Until Discontinued, Normal    !! ipratropium (ATROVENT) 0.06 % nasal spray Place 2 sprays into both nostrils 4 (four) times daily., Starting 01/03/2014, Until Discontinued, Normal    predniSONE (DELTASONE) 20 MG tablet Take 1 tablet (20 mg total) by mouth 2 (two) times daily., Starting 01/03/2014, Until Discontinued, Normal     !! - Potential duplicate medications found. Please discuss with provider.      2.  Patient Education/Counseling:  The patient was given appropriate handouts, self care instructions, and instructed in symptomatic relief.  Instructed to get extra fluids, rest, and use a cool mist vaporizer.    3.  Follow up:  The patient was told to follow up here if no better in 3 to 4 days, or sooner if becoming worse in any way, and given some red flag symptoms such as increasing fever, difficulty breathing, chest pain, or persistent vomiting which would prompt immediate return.  Follow up here as needed.      Reuben Likesavid C Lacole Komorowski, MD 01/03/14 1009

## 2014-05-20 ENCOUNTER — Ambulatory Visit (INDEPENDENT_AMBULATORY_CARE_PROVIDER_SITE_OTHER): Payer: Commercial Managed Care - PPO | Admitting: Family Medicine

## 2014-05-20 ENCOUNTER — Telehealth: Payer: Self-pay | Admitting: *Deleted

## 2014-05-20 ENCOUNTER — Encounter: Payer: Self-pay | Admitting: Family Medicine

## 2014-05-20 VITALS — BP 150/88 | HR 72 | Ht 61.0 in | Wt 213.0 lb

## 2014-05-20 DIAGNOSIS — E7800 Pure hypercholesterolemia, unspecified: Secondary | ICD-10-CM

## 2014-05-20 DIAGNOSIS — Z0000 Encounter for general adult medical examination without abnormal findings: Secondary | ICD-10-CM

## 2014-05-20 DIAGNOSIS — J45909 Unspecified asthma, uncomplicated: Secondary | ICD-10-CM

## 2014-05-20 DIAGNOSIS — E66812 Obesity, class 2: Secondary | ICD-10-CM

## 2014-05-20 DIAGNOSIS — E669 Obesity, unspecified: Secondary | ICD-10-CM

## 2014-05-20 DIAGNOSIS — E010 Iodine-deficiency related diffuse (endemic) goiter: Secondary | ICD-10-CM

## 2014-05-20 DIAGNOSIS — E049 Nontoxic goiter, unspecified: Secondary | ICD-10-CM

## 2014-05-20 DIAGNOSIS — J309 Allergic rhinitis, unspecified: Secondary | ICD-10-CM

## 2014-05-20 DIAGNOSIS — M659 Synovitis and tenosynovitis, unspecified: Secondary | ICD-10-CM

## 2014-05-20 DIAGNOSIS — I10 Essential (primary) hypertension: Secondary | ICD-10-CM

## 2014-05-20 DIAGNOSIS — M7750 Other enthesopathy of unspecified foot: Secondary | ICD-10-CM

## 2014-05-20 LAB — POCT URINALYSIS DIPSTICK
Bilirubin, UA: NEGATIVE
Blood, UA: NEGATIVE
GLUCOSE UA: NEGATIVE
KETONES UA: NEGATIVE
LEUKOCYTES UA: NEGATIVE
Nitrite, UA: NEGATIVE
PROTEIN UA: NEGATIVE
Spec Grav, UA: <=1.005
UROBILINOGEN UA: NEGATIVE
pH, UA: 6

## 2014-05-20 MED ORDER — MONTELUKAST SODIUM 10 MG PO TABS: 10.0000 mg | ORAL_TABLET | Freq: Every day | ORAL | Status: DC

## 2014-05-20 MED ORDER — ALBUTEROL SULFATE HFA 108 (90 BASE) MCG/ACT IN AERS: 2.0000 | INHALATION_SPRAY | Freq: Four times a day (QID) | RESPIRATORY_TRACT | Status: DC | PRN

## 2014-05-20 MED ORDER — FLUTICASONE PROPIONATE 50 MCG/ACT NA SUSP: 2.0000 | Freq: Every day | NASAL | Status: DC

## 2014-05-20 MED ORDER — LISINOPRIL-HYDROCHLOROTHIAZIDE 20-25 MG PO TABS: 1.0000 | ORAL_TABLET | Freq: Every day | ORAL | Status: DC

## 2014-05-20 NOTE — Telephone Encounter (Signed)
Patient left a message with the front that she needed refills on lisinopril, singulair, albuterol and flonase sent to [facility]-(looks like you may have done Flonase already)

## 2014-05-20 NOTE — Patient Instructions (Addendum)
  HEALTH MAINTENANCE RECOMMENDATIONS:  It is recommended that you get at least 30 minutes of aerobic exercise at least 5 days/week (for weight loss, you may need as much as 60-90 minutes). This can be any activity that gets your heart rate up. This can be divided in 10-15 minute intervals if needed, but try and build up your endurance at least once a week.  Weight bearing exercise is also recommended twice weekly.  Eat a healthy diet with lots of vegetables, fruits and fiber.  "Colorful" foods have a lot of vitamins (ie green vegetables, tomatoes, red peppers, etc).  Limit sweet tea, regular sodas and alcoholic beverages, all of which has a lot of calories and sugar.  Up to 1 alcoholic drink daily may be beneficial for women (unless trying to lose weight, watch sugars).  Drink a lot of water.  Calcium recommendations are 1200-1500 mg daily (1500 mg for postmenopausal women or women without ovaries), and vitamin D 1000 IU daily.  This should be obtained from diet and/or supplements (vitamins), and calcium should not be taken all at once, but in divided doses.  Monthly self breast exams and yearly mammograms for women over the age of 37 is recommended.  Sunscreen of at least SPF 30 should be used on all sun-exposed parts of the skin when outside between the hours of 10 am and 4 pm (not just when at beach or pool, but even with exercise, golf, tennis, and yard work!)  Use a sunscreen that says "broad spectrum" so it covers both UVA and UVB rays, and make sure to reapply every 1-2 hours.  Remember to change the batteries in your smoke detectors when changing your clock times in the spring and fall.  Use your seat belt every time you are in a car, and please drive safely and not be distracted with cell phones and texting while driving.  Please schedule your mammogram! You are being set up to have an ultrasound of the thyroid, due to it feeling enlarged on today's exam. (the blood test tells [place] only about  the FUNCTION of the thyroid, not the structure)  Start exercising daily, as we discussed, and try and lose the weight that you have regained (and then more!).

## 2014-05-20 NOTE — Progress Notes (Signed)
Chief Complaint  Patient presents with  . Annual Exam    fasting annual exam. Patient does not desire to do GYN care today-states she will call and schedule appt with a gynecologist. Right foot pain and swelling x 3 weeks.    Natasha Bauer is a 58 y.o. female who presents for a complete physical. Initially she stated she would schedule with GYN, but then admits that she didn't want to see anyone else, prefers to have exam done here (she didn't think she needed any exams, wasn't really going to go to GYN). She has the following concerns:  3 weeks ago she developed pain and swelling on the top of her right foot/anterior ankle.  She started drinking more water, which helped.  Pain and swelling have been resolving.  She had worn a pair of unsupportive shoes for about a week prior to the onset of pain.  No injury or change in activity. A can of soda hit the top of her foot, but that was much prior to the onset of swelling, and was a little further down on the foot.  Hypertension follow-up: Blood pressures elsewhere are 112-142/75-92, usually 120-130's/80's. Denies dizziness, headaches, chest pain. Denies side effects of medications. She has not been exercising (previously walked, but then got too cold); she continues to get some exercise in the house, occasionally while holding a light-weight object.  Asthma/allergies: She had a URI with a flare of asthma back in January, which required prednisone.  Has been doing well since then.  She is using the Singulair daily, but only uses the flonase occasionally.  Has not needed to use rescue since her illness in January.  Immunization History  Administered Date(s) Administered  . Influenza Split 08/21/2012, 09/10/2013  . Pneumococcal Polysaccharide-23 12/28/2010  . Tdap 04/09/2012   Last Pap smear: N/a (s/p hysterectomy for benign reasons)  Last mammogram: 3 years ago  Last colonoscopy: 08/2012, showed sessile polyp  Last DEXA: never  Dentist: 3  times last year, and now seeing specialist for bone loss Ophtho:1.5 years ago, plans to go next month. Exercise: limited.  She plans to start walking the track again once school is out.  Does some weight-bearing exercises in the house   Past Medical History  Diagnosis Date  . Hypertension age 52  . Asthma   . Seasonal allergies   . Colon polyp     hyperplastic in 2004 and 2013   . Tenosynovitis, de Quervain     Dr. Amanda Pea, resolved   Past Surgical History  Procedure Laterality Date  . Total abdominal hysterectomy w/ bilateral salpingoophorectomy  05/2003    fibroids  . Colonoscopy  08/20/12    Dr. Elnoria Howard (hyperplastic polyp)   History   Social History  . Marital Status: Married    Spouse Name: N/A    Number of Children: 0  . Years of Education: N/A   Occupational History  . food Market researcher, and private caregiver [employer]  . [employer] (food service tech) [place]   Social History Main Topics  . Smoking status: Former Smoker -- 0.50 packs/day for 8 years    Types: Cigarettes    Quit date: 12/12/1999  . Smokeless tobacco: Never Used  . Alcohol Use: No  . Drug Use: No  . Sexual Activity: Yes    Partners: Male    Birth Control/ Protection: Surgical   Other Topics Concern  . Not on file   Social History Narrative   Lives with her  husband. No children, 1 dog.  Works as a Microbiologist and a Radiographer, therapeutic at [employer]   Family History  Problem Relation Age of Onset  . Hypertension Father   . Cancer Mother 45    colon cancer  . Diabetes Mother   . Colon cancer Mother 68  . Arthritis Sister     rheumatoid arthritis  . Hypertension Sister   . Arthritis Brother     rheumatoid arthritis  . Heart disease Neg Hx     Outpatient Encounter Prescriptions as of 05/20/2014  Medication Sig Note  . aspirin 81 MG tablet Take 81 mg by mouth daily.   Marland Kitchen lisinopril-hydrochlorothiazide (PRINZIDE,ZESTORETIC) 20-25 MG per tablet Take 1 tablet by mouth daily.   .  montelukast (SINGULAIR) 10 MG tablet Take 1 tablet (10 mg total) by mouth at bedtime.   . Multiple Vitamins-Minerals (CENTRUM SILVER PO) Take 1 tablet by mouth daily.   Marland Kitchen albuterol (PROVENTIL HFA;VENTOLIN HFA) 108 (90 BASE) MCG/ACT inhaler Inhale 2 puffs into the lungs every 6 (six) hours as needed for wheezing or shortness of breath. 05/20/2014: Uses prn--hasn't needed in months  . fluticasone (FLONASE) 50 MCG/ACT nasal spray Place 2 sprays into the nose daily. 05/20/2014: Uses prn, hasn't been needing  . [DISCONTINUED] benzonatate (TESSALON) 200 MG capsule Take 1 capsule (200 mg total) by mouth 3 (three) times daily as needed for cough.   . [DISCONTINUED] ipratropium (ATROVENT) 0.06 % nasal spray Place 2 sprays into the nose 4 (four) times daily. Use as needed for runny nose   . [DISCONTINUED] ipratropium (ATROVENT) 0.06 % nasal spray Place 2 sprays into both nostrils 4 (four) times daily.   . [DISCONTINUED] predniSONE (DELTASONE) 20 MG tablet Take 1 tablet (20 mg total) by mouth 2 (two) times daily.    Allergies  Allergen Reactions  . Codeine Other (See Comments)    headache  . Penicillins Other (See Comments)    headache  . Sinus & Allergy [Pseudoephedrine] Other (See Comments)    Sinus medications do not work for her, they dry her up so badly that they give her asthma attacks.   ROS: The patient denies anorexia, fever, headaches, vision changes, decreased hearing, ear pain, sore throat, breast concerns, chest pain, palpitations, dizziness, syncope, dyspnea on exertion, cough, swelling, nausea, vomiting, diarrhea, constipation, abdominal pain, melena, hematochezia, indigestion/heartburn, hematuria, incontinence, dysuria, vaginal bleeding, discharge, odor or itch, genital lesions, joint pains, numbness, tingling, weakness, tremor, suspicious skin lesions, depression, anxiety, abnormal bleeding/bruising, or enlarged lymph nodes.  She has gained 7 pounds since her last visit 6 months  ago. Occasional night sweats, tolerable   PHYSICAL EXAM: BP 150/88  Pulse 72  Ht 5\' 1"  (1.549 m)  Wt 213 lb (96.616 kg)  BMI 40.27 kg/m2 142/86 on re  General Appearance:    Alert, cooperative, no distress, appears stated age  Head:    Normocephalic, without obvious abnormality, atraumatic  Eyes:    PERRL, conjunctiva/corneas clear, EOM's intact, fundi    benign  Ears:    Normal TM's and external ear canals  Nose:   Nares normal, mucosa normal, no drainage or sinus   tenderness  Throat:   Lips, mucosa, and tongue normal; teeth and gums normal  Neck:   Supple, no lymphadenopathy;  thyroid: enlarged, R>L, no dominant mass. no carotid bruit or JVD  Back:    Spine nontender, no curvature, ROM normal, no CVA     tenderness  Lungs:     Clear to auscultation  bilaterally without wheezes, rales or     ronchi; respirations unlabored  Chest Wall:    No tenderness or deformity   Heart:    Regular rate and rhythm, S1 and S2 normal, no murmur, rub   or gallop  Breast Exam:    No tenderness, masses, or nipple discharge or inversion.      No axillary lymphadenopathy  Abdomen:     Soft, non-tender, nondistended, normoactive bowel sounds,    no masses, no hepatosplenomegaly.  Obese.  WHSS inferiorly  Genitalia:    Normal external genitalia without lesions.  BUS and vagina normal; uterus is surgically absent.  No adnexal masses or tenderness appreciable.  There is some firmness/scar tissue, and due to body habitus, some limitation of exam.  No masses are appreciable, and no tenderness. Pap not performed  Rectal:    Normal tone, no masses or tenderness; guaiac negative stool  Extremities:   No clubbing, cyanosis or edema.  Nontender, no swelling noted on right foot.  Pain with inversion against resistance on R foot.  Pulses:   2+ and symmetric all extremities  Skin:   Skin color, texture, turgor normal, no rashes or lesions  Lymph nodes:   Cervical, supraclavicular, and axillary nodes normal   Neurologic:   CNII-XII intact, normal strength, sensation and gait; reflexes 2+ and symmetric throughout          Psych:   Normal mood, affect, hygiene and grooming.     ASSESSMENT/PLAN:  Routine general medical examination at a health care facility - Plan: POCT Urinalysis Dipstick, Visual acuity screening, Comprehensive metabolic panel, Lipid panel, TSH  Essential hypertension, benign - controlled - Plan: Comprehensive metabolic panel, Lipid panel, lisinopril-hydrochlorothiazide (PRINZIDE,ZESTORETIC) 20-25 MG per tablet  Asthma - controlled - Plan: montelukast (SINGULAIR) 10 MG tablet, albuterol (PROVENTIL HFA;VENTOLIN HFA) 108 (90 BASE) MCG/ACT inhaler  Allergic rhinitis, cause unspecified - controlled - Plan: fluticasone (FLONASE) 50 MCG/ACT nasal spray, montelukast (SINGULAIR) 10 MG tablet  Obesity, Class II, BMI 35-39.9 - with her weight gain, BMI is now >40.  risks reviewed.  encouraged exercise, diet, weight loss  Thyromegaly - check ultrasound.  - Plan: TSH, US Soft Tissue Head/Neck  Pure hypercholesterolemia  Foot tendinitis - right foot; improving/resolving  Discussed monthly self breast exams and yearly mammograms (she is past due and encouraged to schedule); at least 30 minutes of aerobic activity at least 5 days/week, weight bearing exercise 2x/wk; proper sunscreen use reviewed; healthy diet, including goals of calcium and vitamin D intake and alcohol recommendations (less than or equal to 1 drink/day) reviewed; regular seatbelt use; changing batteries in smoke detectors. Immunization recommendations discussed, UTD--consider zostavax when covered by insurance (likely age 58). Colonoscopy recommendations reviewed--UTD.   F/u 6 mos for med check

## 2014-05-20 NOTE — Telephone Encounter (Signed)
Done through her encounter

## 2014-05-21 ENCOUNTER — Encounter: Payer: Self-pay | Admitting: Family Medicine

## 2014-05-21 ENCOUNTER — Other Ambulatory Visit: Payer: 59

## 2014-05-21 LAB — LIPID PANEL
Cholesterol: 200 mg/dL (ref 0–200)
HDL: 97 mg/dL (ref >39)
LDL CALC: 89 mg/dL (ref 0–99)
Total CHOL/HDL Ratio: 2.1 Ratio
Triglycerides: 69 mg/dL (ref <150)
VLDL: 14 mg/dL (ref 0–40)

## 2014-05-21 LAB — COMPREHENSIVE METABOLIC PANEL
ALBUMIN: 4.3 g/dL (ref 3.5–5.2)
ALK PHOS: 49 U/L (ref 39–117)
ALT: 19 U/L (ref 0–35)
AST: 20 U/L (ref 0–37)
BILIRUBIN TOTAL: 0.2 mg/dL (ref 0.2–1.2)
BUN: 16 mg/dL (ref 6–23)
CO2: 26 mEq/L (ref 19–32)
Calcium: 9.5 mg/dL (ref 8.4–10.5)
Chloride: 101 mEq/L (ref 96–112)
Creat: 0.7 mg/dL (ref 0.50–1.10)
Glucose, Bld: 98 mg/dL (ref 70–99)
POTASSIUM: 4.2 meq/L (ref 3.5–5.3)
SODIUM: 139 meq/L (ref 135–145)
TOTAL PROTEIN: 6.9 g/dL (ref 6.0–8.3)

## 2014-05-21 LAB — TSH: TSH: 0.629 u[IU]/mL (ref 0.350–4.500)

## 2014-05-22 ENCOUNTER — Ambulatory Visit
Admission: RE | Admit: 2014-05-22 | Discharge: 2014-05-22 | Disposition: A | Discharge status: Home / Self Care | Payer: 59 | Source: Ambulatory Visit | Attending: Family Medicine | Admitting: Family Medicine

## 2014-05-22 ENCOUNTER — Encounter: Payer: Self-pay | Admitting: Family Medicine

## 2014-05-22 DIAGNOSIS — E010 Iodine-deficiency related diffuse (endemic) goiter: Secondary | ICD-10-CM

## 2014-05-22 DIAGNOSIS — E041 Nontoxic single thyroid nodule: Secondary | ICD-10-CM | POA: Insufficient documentation

## 2014-05-25 ENCOUNTER — Other Ambulatory Visit: Payer: Self-pay | Admitting: *Deleted

## 2014-05-25 DIAGNOSIS — E041 Nontoxic single thyroid nodule: Secondary | ICD-10-CM

## 2014-06-02 ENCOUNTER — Ambulatory Visit
Admission: RE | Admit: 2014-06-02 | Discharge: 2014-06-02 | Disposition: A | Discharge status: Home / Self Care | Payer: 59 | Source: Ambulatory Visit | Attending: Family Medicine | Admitting: Family Medicine

## 2014-06-02 ENCOUNTER — Other Ambulatory Visit (HOSPITAL_COMMUNITY)
Admission: RE | Admit: 2014-06-02 | Discharge: 2014-06-02 | Disposition: A | Discharge status: Home / Self Care | Payer: 59 | Source: Ambulatory Visit | Attending: Interventional Radiology | Admitting: Interventional Radiology

## 2014-06-02 DIAGNOSIS — E041 Nontoxic single thyroid nodule: Secondary | ICD-10-CM

## 2014-06-30 ENCOUNTER — Other Ambulatory Visit: Payer: Self-pay

## 2014-06-30 DIAGNOSIS — Z1231 Encounter for screening mammogram for malignant neoplasm of breast: Secondary | ICD-10-CM

## 2014-07-03 LAB — HM MAMMOGRAPHY

## 2014-07-06 ENCOUNTER — Encounter: Payer: Self-pay | Admitting: Internal Medicine

## 2014-11-23 ENCOUNTER — Encounter: Payer: Self-pay | Admitting: Family Medicine

## 2014-11-23 ENCOUNTER — Ambulatory Visit (INDEPENDENT_AMBULATORY_CARE_PROVIDER_SITE_OTHER): Payer: Commercial Managed Care - PPO | Admitting: Family Medicine

## 2014-11-23 VITALS — BP 122/78 | HR 80 | Temp 98.0°F | Ht 61.0 in | Wt 206.0 lb

## 2014-11-23 DIAGNOSIS — J309 Allergic rhinitis, unspecified: Secondary | ICD-10-CM

## 2014-11-23 DIAGNOSIS — H9201 Otalgia, right ear: Secondary | ICD-10-CM

## 2014-11-23 DIAGNOSIS — Z5181 Encounter for therapeutic drug level monitoring: Secondary | ICD-10-CM

## 2014-11-23 DIAGNOSIS — I10 Essential (primary) hypertension: Secondary | ICD-10-CM

## 2014-11-23 DIAGNOSIS — Z0000 Encounter for general adult medical examination without abnormal findings: Secondary | ICD-10-CM

## 2014-11-23 DIAGNOSIS — J4520 Mild intermittent asthma, uncomplicated: Secondary | ICD-10-CM

## 2014-11-23 MED ORDER — MONTELUKAST SODIUM 10 MG PO TABS: 10.0000 mg | ORAL_TABLET | Freq: Every day | ORAL | Status: DC

## 2014-11-23 MED ORDER — LISINOPRIL-HYDROCHLOROTHIAZIDE 20-25 MG PO TABS: 1.0000 | ORAL_TABLET | Freq: Every day | ORAL | Status: DC

## 2014-11-23 NOTE — Progress Notes (Signed)
Chief Complaint  Patient presents with  . Hypertension    nonfasting med check.   . Ear Pain    right ear pain-started 2 weeks ago with ear itching.    Hypertension follow-up: Blood pressures elsewhere are mostly 115-135/72-85; she has some recent BP's up to 130's/90's, [facility].  Denies dizziness, headaches, chest pain. Denies side effects of medications. She has not been exercising, just staying active, "busy".  Asthma/allergies: She is using the Singulair daily. She last used albuterol a couple of months ago (had some sniffles and slight illness after getting flu shot--[facility] Her allergies have not been flaring at all, has not been using flonase.   She started itching in the right ear 2 weeks ago, and then last week it started hurting.  Denies any ear drainage, decrease in hearing, plugging/popping.  Just occasional ringing.   She had 7 teeth pulled in 1 day back in August, then two more a month later.  She continues to have some tooth pain on the right, and is using Orajel as needed (right lower posterior teeth).   PMH, PSH, SH and FH reviewed.  Outpatient Encounter Prescriptions as of 11/23/2014  Medication Sig  . aspirin 81 MG tablet Take 81 mg by mouth daily.  Marland Kitchen. lisinopril-hydrochlorothiazide (PRINZIDE,ZESTORETIC) 20-25 MG per tablet Take 1 tablet by mouth daily.  . montelukast (SINGULAIR) 10 MG tablet Take 1 tablet (10 mg total) by mouth at bedtime.  . Multiple Vitamins-Minerals (CENTRUM SILVER PO) Take 1 tablet by mouth daily.  Marland Kitchen. albuterol (PROVENTIL HFA;VENTOLIN HFA) 108 (90 BASE) MCG/ACT inhaler Inhale 2 puffs into the lungs every 6 (six) hours as needed for wheezing or shortness of breath. (Patient not taking: Reported on 11/23/2014)  . fluticasone (FLONASE) 50 MCG/ACT nasal spray Place 2 sprays into both nostrils daily. (Patient not taking: Reported on 11/23/2014)   Aleve makes her heart beat fast.  Ibuprofen upset her stomach some.  She has been  taking Goody powder for her tooth pain.  Allergies  Allergen Reactions  . Codeine Other (See Comments)    headache  . Penicillins Other (See Comments)    headache  . Sinus & Allergy [Pseudoephedrine] Other (See Comments)    Sinus medications do not work for her, they dry her up so badly that they give her asthma attacks.    ROS: Denies fevers, chills, headaches, dizziness.  No URI symptoms other than right ear pain.  No cough, shortness of breath, wheezing. No nausea, vomiting, bowel changes, urinary complaints. No bleeding/bruising.  Moods are good.  No joint pains (occasional right shoulder)   PHYSICAL EXAM:  BP 122/78 mmHg  Pulse 80  Temp(Src) 98 F (36.7 C) (Tympanic)  Ht 5\' 1"  (1.549 m)  Wt 206 lb (93.441 kg)  BMI 38.94 kg/m2 Very pleasant, well-appearing female in no distress HEENT:  TM's and EAC's are normal bilaterally.  OP--there is some white area and slight swelling right posterior teeth, inferiorly--pt states the white area is Orajel that she has been applying. No soft tissue swelling into the cheek, or erythema.  Remainder of OP is clear Nontender at TMJ, no lymphadenopathy, nontender at Jaw (but when the teeth hurts, the pain shoots from her teeth to the right ear--not currently). Neck: no lymphadenopathy or mass Heart: regular rate and rhythm without murmur Lungs: clear bilaterally Abdomen: soft, nontender, no mass Extremities: no edema Neuro: alert and oriented.  Cranial nerves intact. Normal strength, gait Psych: normal mood, affect, hygiene and grooming  ASSESSMENT/PLAN:  Essential hypertension, benign - well controlled - Plan: lisinopril-hydrochlorothiazide (PRINZIDE,ZESTORETIC) 20-25 MG per tablet, Lipid panel, Comprehensive metabolic panel  Asthma, mild intermittent, uncomplicated - well controlled with daily singulair; continue.  still has refill left on albuterol inhaler - Plan: montelukast (SINGULAIR) 10 MG tablet  Allergic rhinitis, unspecified  allergic rhinitis type - worse seasonally; fine now.  Use Flonase in the Spring. - Plan: montelukast (SINGULAIR) 10 MG tablet  Annual physical exam - Plan: CBC with Differential, Lipid panel, TSH, Comprehensive metabolic panel  Medication monitoring encounter - Plan: CBC with Differential, TSH, Comprehensive metabolic panel  Right ear pain - normal exam--suspect related to dental pain. f/u with dentist.  use ibuprofen and/or tylenol as needed for pain   Return in 6 months for CPE/med check with fasting labs prior

## 2014-11-23 NOTE — Patient Instructions (Signed)
Use tylenol (extra strength) in addition to [facility] OR ibuprofen (don't use BC and ibuprofen together). Follow up with the dentist.  Your ear is normal.  It is recommended that you get at least 30 minutes of aerobic exercise at least 5 days/week (for weight loss, you may need as much as 60-90 minutes). This can be any activity that gets your heart rate up. This can be divided in 10-15 minute intervals if needed, but try and build up your endurance at least once a week.  Weight bearing exercise is also recommended twice weekly.  Continue to monitor your blood pressure. Continue your current medications.

## 2015-05-17 ENCOUNTER — Other Ambulatory Visit: Payer: 59

## 2015-05-17 DIAGNOSIS — I10 Essential (primary) hypertension: Secondary | ICD-10-CM

## 2015-05-17 DIAGNOSIS — Z0000 Encounter for general adult medical examination without abnormal findings: Secondary | ICD-10-CM

## 2015-05-17 DIAGNOSIS — Z5181 Encounter for therapeutic drug level monitoring: Secondary | ICD-10-CM

## 2015-05-18 LAB — COMPREHENSIVE METABOLIC PANEL
ALT: 20 U/L (ref 0–35)
AST: 20 U/L (ref 0–37)
Albumin: 4 g/dL (ref 3.5–5.2)
Alkaline Phosphatase: 52 U/L (ref 39–117)
BUN: 18 mg/dL (ref 6–23)
CO2: 28 mEq/L (ref 19–32)
CREATININE: 0.76 mg/dL (ref 0.50–1.10)
Calcium: 9.3 mg/dL (ref 8.4–10.5)
Chloride: 102 mEq/L (ref 96–112)
Glucose, Bld: 93 mg/dL (ref 70–99)
Potassium: 4.2 mEq/L (ref 3.5–5.3)
Sodium: 139 mEq/L (ref 135–145)
Total Bilirubin: 0.2 mg/dL (ref 0.2–1.2)
Total Protein: 7 g/dL (ref 6.0–8.3)

## 2015-05-18 LAB — CBC WITH DIFFERENTIAL/PLATELET
Basophils Absolute: 0 10*3/uL (ref 0.0–0.1)
Basophils Relative: 0 % (ref 0–1)
EOS PCT: 4 % (ref 0–5)
Eosinophils Absolute: 0.2 10*3/uL (ref 0.0–0.7)
HCT: 41.3 % (ref 36.0–46.0)
Hemoglobin: 13.5 g/dL (ref 12.0–15.0)
Lymphocytes Relative: 44 % (ref 12–46)
Lymphs Abs: 2.3 10*3/uL (ref 0.7–4.0)
MCH: 25.6 pg — AB (ref 26.0–34.0)
MCHC: 32.7 g/dL (ref 30.0–36.0)
MCV: 78.4 fL (ref 78.0–100.0)
MONO ABS: 0.5 10*3/uL (ref 0.1–1.0)
MPV: 10.5 fL (ref 8.6–12.4)
Monocytes Relative: 9 % (ref 3–12)
NEUTROS PCT: 43 % (ref 43–77)
Neutro Abs: 2.3 10*3/uL (ref 1.7–7.7)
Platelets: 206 10*3/uL (ref 150–400)
RBC: 5.27 MIL/uL — AB (ref 3.87–5.11)
RDW: 15.8 % — ABNORMAL HIGH (ref 11.5–15.5)
WBC: 5.3 10*3/uL (ref 4.0–10.5)

## 2015-05-18 LAB — LIPID PANEL
Cholesterol: 194 mg/dL (ref 0–200)
HDL: 94 mg/dL (ref >=46)
LDL Cholesterol: 86 mg/dL (ref 0–99)
TRIGLYCERIDES: 70 mg/dL (ref <150)
Total CHOL/HDL Ratio: 2.1 Ratio
VLDL: 14 mg/dL (ref 0–40)

## 2015-05-18 LAB — TSH: TSH: 0.639 u[IU]/mL (ref 0.350–4.500)

## 2015-05-27 ENCOUNTER — Ambulatory Visit (INDEPENDENT_AMBULATORY_CARE_PROVIDER_SITE_OTHER): Payer: 59 | Admitting: Family Medicine

## 2015-05-27 ENCOUNTER — Encounter: Payer: Self-pay | Admitting: Family Medicine

## 2015-05-27 VITALS — BP 148/98 | HR 72 | Ht 61.0 in | Wt 213.2 lb

## 2015-05-27 DIAGNOSIS — Z0000 Encounter for general adult medical examination without abnormal findings: Secondary | ICD-10-CM

## 2015-05-27 DIAGNOSIS — I10 Essential (primary) hypertension: Secondary | ICD-10-CM

## 2015-05-27 DIAGNOSIS — J309 Allergic rhinitis, unspecified: Secondary | ICD-10-CM

## 2015-05-27 DIAGNOSIS — J4520 Mild intermittent asthma, uncomplicated: Secondary | ICD-10-CM

## 2015-05-27 LAB — POCT URINALYSIS DIPSTICK
BILIRUBIN UA: NEGATIVE
Glucose, UA: NEGATIVE
KETONES UA: NEGATIVE
Leukocytes, UA: NEGATIVE
Nitrite, UA: NEGATIVE
PH UA: 7
PROTEIN UA: NEGATIVE
RBC UA: NEGATIVE
Spec Grav, UA: 1.015
Urobilinogen, UA: NEGATIVE

## 2015-05-27 MED ORDER — MONTELUKAST SODIUM 10 MG PO TABS: 10.0000 mg | ORAL_TABLET | Freq: Every day | ORAL | Status: DC

## 2015-05-27 MED ORDER — LISINOPRIL-HYDROCHLOROTHIAZIDE 20-25 MG PO TABS: 1.0000 | ORAL_TABLET | Freq: Every day | ORAL | Status: DC

## 2015-05-27 NOTE — Progress Notes (Signed)
Chief Complaint  Patient presents with  . Annual Exam    nonfasting annual exam with pelvic/med check. Did not do eye exam, just had one in March in March. No concerns.    Natasha Bauer is a 59 y.o. female who presents for a complete physical.  She has the following concerns:  Hypertension follow-up: Blood pressures elsewhere are mostly 120's/80's (brings in book showing range from 109-137/77-93). Denies dizziness, headaches, chest pain. Denies side effects of medications.  Asthma/allergies: She is using the Singulair daily. She last used albuterol a couple of months ago. Her allergies have not been flaring recently, has not been using flonase for the last 2 months; uses in the spring.   Obesity:  She admits that "drinking Coke is her weakness", has been drinking a lot of soda.  She also admits to often eating only 1 meal/day.  Now that school is out, she is back at the track and increasing her walking.  Thyroid nodules--she had ultrasound and biopsy last year (benign).  She has recently had a lot of dentalwork, and just finished antibiotics. She recently had a tooth pulled on the lower left jaw  Immunization History  Administered Date(s) Administered  . Influenza Split 08/21/2012, 09/10/2013  . Pneumococcal Polysaccharide-23 12/28/2010  . Tdap 04/09/2012  gets flu shots yearly at [facility] Last Pap smear: N/a (s/p hysterectomy for benign reasons)  Last mammogram: 06/2014  Last colonoscopy: 08/2012, showed sessile polyp--hyperplastic (Dr. Elnoria Howard).  Pt states 10 yr follow-up recommended Last DEXA: never  Dentist: at least 4-5 x/year due to having problems with gums, and dentalwork. Ophtho:  March 2016; got new glasses; goes yearly Exercise: Exercises in her home on her days off (weight-bearing) but no regular cardio.  Since school is out, she goes to the track on her days off--starts walking a mile, and gradually increases.  She works her way up to almost an hour. Goes 3x/week.  Past  Medical History  Diagnosis Date  . Hypertension age 19  . Asthma   . Seasonal allergies   . Colon polyp     hyperplastic in 2004 and 2013   . Tenosynovitis, de Quervain     Dr. Amanda Pea, resolved    Past Surgical History  Procedure Laterality Date  . Total abdominal hysterectomy w/ bilateral salpingoophorectomy  05/2003    fibroids  . Colonoscopy  08/20/12    Dr. Elnoria Howard (hyperplastic polyp)    History   Social History  . Marital Status: Married    Spouse Name: N/A  . Number of Children: 0  . Years of Education: N/A   Occupational History  . food Market researcher, and private caregiver [facility]  . [employer] (food service tech) [place]   Social History Main Topics  . Smoking status: Former Smoker -- 0.50 packs/day for 8 years    Types: Cigarettes    Quit date: 12/12/1999  . Smokeless tobacco: Never Used  . Alcohol Use: No  . Drug Use: No  . Sexual Activity:    Partners: Male    Birth Control/ Protection: Surgical   Other Topics Concern  . Not on file   Social History Narrative   Lives with her husband. No children, 1 dog.  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 Hypertension Father   . Cancer Mother 20    colon cancer  .  Diabetes Mother   . Colon cancer Mother 20  . Arthritis Sister     rheumatoid arthritis  . Hypertension Sister   . Arthritis Brother     rheumatoid arthritis  . Heart disease Neg Hx     Outpatient Encounter Prescriptions as of 05/27/2015  Medication Sig Note  . albuterol (PROVENTIL HFA;VENTOLIN HFA) 108 (90 BASE) MCG/ACT inhaler Inhale 2 puffs into the lungs every 6 (six) hours as needed for wheezing or shortness of breath. (Patient not taking: Reported on 11/23/2014) 05/27/2015: Uses prn, maybe once every 2 months  . aspirin 81 MG tablet Take 81 mg by mouth daily.   . fluticasone (FLONASE) 50 MCG/ACT nasal spray Place 2  sprays into both nostrils daily. (Patient not taking: Reported on 11/23/2014) 11/23/2014: Uses seasonally, mainly in the Spring  . lisinopril-hydrochlorothiazide (PRINZIDE,ZESTORETIC) 20-25 MG per tablet Take 1 tablet by mouth daily.   . montelukast (SINGULAIR) 10 MG tablet Take 1 tablet (10 mg total) by mouth at bedtime.   . Multiple Vitamins-Minerals (CENTRUM SILVER PO) Take 1 tablet by mouth daily.    No facility-administered encounter medications on file as of 05/27/2015.    Allergies  Allergen Reactions  . Codeine Other (See Comments)    headache  . Penicillins Other (See Comments)    headache  . Sinus & Allergy [Pseudoephedrine] Other (See Comments)    Sinus medications do not work for her, they dry her up so badly that they give her asthma attacks.   ROS: The patient denies anorexia, fever, headaches, vision changes, decreased hearing, ear pain, sore throat, breast concerns, chest pain, palpitations, dizziness, syncope, dyspnea on exertion, cough, swelling, nausea, vomiting, diarrhea, constipation, abdominal pain, melena, hematochezia, indigestion/heartburn, hematuria, incontinence, dysuria, vaginal bleeding, discharge, odor or itch, genital lesions, joint pains, numbness, tingling, weakness, tremor, suspicious skin lesions, depression, anxiety, abnormal bleeding/bruising, or enlarged lymph nodes.  Occasional sweats, tolerable, mostly just facial.   PHYSICAL EXAM:  BP 148/98 mmHg  Pulse 72  Ht 5\' 1"  (1.549 m)  Wt 213 lb 3.2 oz (96.707 kg)  BMI 40.30 kg/m2 152/92 on repeat by MD  General Appearance:   Alert, cooperative, no distress, appears stated age  Head:   Normocephalic, without obvious abnormality, atraumatic  Eyes:   PERRL, conjunctiva/corneas clear, EOM's intact, fundi   benign  Ears:   Normal TM's and external ear canals  Nose:  Nares normal, mucosa normal, no drainage or sinus tenderness  Throat:  Lips, mucosa, and tongue normal; teeth and gums  normal  Neck:  Supple, thyroid: mildly enlarged, R>L, no dominant mass. no carotid bruit or JVD. 1.5 cm nontender submaxillary lymph node is present on the left.  Back:  Spine nontender, no curvature, ROM normal, no CVA tenderness  Lungs:   Clear to auscultation bilaterally without wheezes, rales or ronchi; respirations unlabored  Chest Wall:   No tenderness or deformity  Heart:   Regular rate and rhythm, S1 and S2 normal, no murmur, rub  or gallop  Breast Exam:   No tenderness, masses, or nipple discharge or inversion. No axillary lymphadenopathy  Abdomen:   Soft, non-tender, nondistended, normoactive bowel sounds,   no masses, no hepatosplenomegaly. Obese. WHSS inferiorly  Genitalia:   Normal external genitalia without lesions. BUS and vagina normal; uterus is surgically absent. No adnexal masses or tenderness appreciable.Some limitation of exam due to body habitus. No masses are appreciable, and no tenderness. Pap not performed  Rectal:   Normal tone, no masses or tenderness; guaiac negative stool  Extremities:  No clubbing, cyanosis or edema. Nontender, no swelling noted on right foot. Pain with inversion against resistance on R foot.  Pulses:  2+ and symmetric all extremities  Skin:  Skin color, texture, turgor normal, no rashes or lesions. Small (2-21mm) sebaceous cyst in central lower neck, and larger one on left shoulder. nontender  Lymph nodes:  Cervical, supraclavicular, and axillary nodes normal  Neurologic:  CNII-XII intact, normal strength, sensation and gait; reflexes 2+ and symmetric throughout   Psych: Normal mood, affect, hygiene and grooming.       Urine dip normal.  Lab Results  Component Value Date   TSH 0.639 05/17/2015     Chemistry      Component Value Date/Time   NA 139 05/17/2015 0001   K 4.2 05/17/2015 0001   CL 102 05/17/2015 0001   CO2 28 05/17/2015 0001    BUN 18 05/17/2015 0001   CREATININE 0.76 05/17/2015 0001   CREATININE 0.88 08/01/2012 0549      Component Value Date/Time   CALCIUM 9.3 05/17/2015 0001   ALKPHOS 52 05/17/2015 0001   AST 20 05/17/2015 0001   ALT 20 05/17/2015 0001   BILITOT 0.2 05/17/2015 0001     Fasting glucose 93  Lab Results  Component Value Date   CHOL 194 05/17/2015   HDL 94 05/17/2015   LDLCALC 86 05/17/2015   TRIG 70 05/17/2015   CHOLHDL 2.1 05/17/2015   Lab Results  Component Value Date   WBC 5.3 05/17/2015   HGB 13.5 05/17/2015   HCT 41.3 05/17/2015   MCV 78.4 05/17/2015   PLT 206 05/17/2015     ASSESSMENT/PLAN:  Annual physical exam - Plan: POCT Urinalysis Dipstick  Essential hypertension, benign - well controlled per home numbers.  Elevated toay. low sodium diet, weight loss, increased exercise - Plan: lisinopril-hydrochlorothiazide (PRINZIDE,ZESTORETIC) 20-25 MG per tablet  Asthma, mild intermittent, uncomplicated - well controlled with daily singulair; continue.  Due for PFT's - Plan: montelukast (SINGULAIR) 10 MG tablet  Allergic rhinitis, unspecified allergic rhinitis type - Continue Flonase in the Spring, daily Singulair - Plan: montelukast (SINGULAIR) 10 MG tablet  Severe obesity (BMI >= 40) - counseled re: diet/exercise--cut back on soda, don't skip meals, increase exercise. nutritionist recommended   Discussed monthly self breast exams and yearly mammograms; at least 30 minutes of aerobic activity at least 5 days/week, weight bearing exercise 2x/wk; proper sunscreen use reviewed; healthy diet, including goals of calcium and vitamin D intake and alcohol recommendations (less than or equal to 1 drink/day) reviewed; regular seatbelt use; changing batteries in smoke detectors. Immunization recommendations discussed, UTD--consider zostavax when covered by insurance (likely age 2). Colonoscopy recommendations reviewed--UTD. Given mother's h/o colon cancer in her 80's, I recommended she  double check with GI in 2018 (5 years from last colonoscopy) if she should have colonoscopy at 7 vs 10 years (as she states).   Do not skip meals; try and avoid caloric drinks such as sodas, sweet tea, and juices.  Eat more fruits and vegetables.  Have high protein snacks.  Portion control is key, especially when it comes to carbs (measure amounts of rice, pasta--1/2-1 cup as opposed to a full plate/bowl).   Look into the programs available through [facility] ([facility]) and into their exercise programs as well.  Enlarged lymph node on your left upper neck. I suspect this is related to your recent dental work--make sure to have the dentist check this again when you see him next month.  Follow up for re-evaluation if persistent mass.   Return for PFT's

## 2015-05-27 NOTE — Patient Instructions (Addendum)
  HEALTH MAINTENANCE RECOMMENDATIONS:  It is recommended that you get at least 30 minutes of aerobic exercise at least 5 days/week (for weight loss, you may need as much as 60-90 minutes). This can be any activity that gets your heart rate up. This can be divided in 10-15 minute intervals if needed, but try and build up your endurance at least once a week.  Weight bearing exercise is also recommended twice weekly.  Eat a healthy diet with lots of vegetables, fruits and fiber.  "Colorful" foods have a lot of vitamins (ie green vegetables, tomatoes, red peppers, etc).  Limit sweet tea, regular sodas and alcoholic beverages, all of which has a lot of calories and sugar.  Up to 1 alcoholic drink daily may be beneficial for women (unless trying to lose weight, watch sugars).  Drink a lot of water.  Calcium recommendations are 1200-1500 mg daily (1500 mg for postmenopausal women or women without ovaries), and vitamin D 1000 IU daily.  This should be obtained from diet and/or supplements (vitamins), and calcium should not be taken all at once, but in divided doses.  Monthly self breast exams and yearly mammograms for women over the age of 47 is recommended.  Sunscreen of at least SPF 30 should be used on all sun-exposed parts of the skin when outside between the hours of 10 am and 4 pm (not just when at beach or pool, but even with exercise, golf, tennis, and yard work!)  Use a sunscreen that says "broad spectrum" so it covers both UVA and UVB rays, and make sure to reapply every 1-2 hours.  Remember to change the batteries in your smoke detectors when changing your clock times in the spring and fall.  Use your seat belt every time you are in a car, and please drive safely and not be distracted with cell phones and texting while driving.   Do not skip meals; try and avoid caloric drinks such as sodas, sweet tea, and juices.  Eat more fruits and vegetables.  Have high protein snacks.  Portion control is  key, especially when it comes to carbs (measure amounts of rice, pasta--1/2-1 cup as opposed to a full plate/bowl).   Look into the programs available through [facility] ([facility]) and into their exercise programs as well.  Enlarged lymph node on your left upper neck. I suspect this is related to your recent dental work--make sure to have the dentist check this again when you see him next month.

## 2015-06-02 ENCOUNTER — Other Ambulatory Visit (INDEPENDENT_AMBULATORY_CARE_PROVIDER_SITE_OTHER): Payer: 59

## 2015-06-02 DIAGNOSIS — J45909 Unspecified asthma, uncomplicated: Secondary | ICD-10-CM | POA: Diagnosis not present

## 2015-08-26 LAB — HM MAMMOGRAPHY

## 2015-08-31 ENCOUNTER — Encounter: Payer: Self-pay | Admitting: Family Medicine

## 2015-09-08 ENCOUNTER — Other Ambulatory Visit: Payer: Self-pay | Admitting: Family Medicine

## 2015-09-08 NOTE — Telephone Encounter (Signed)
Is this okay to refill? Last fill was in 2015.

## 2015-09-11 HISTORY — PX: LACERATION REPAIR: SHX5168

## 2015-10-07 ENCOUNTER — Emergency Department (HOSPITAL_COMMUNITY)
Admission: EM | Admit: 2015-10-07 | Discharge: 2015-10-08 | Disposition: A | Discharge status: Home / Self Care | Payer: 59 | Attending: Emergency Medicine | Admitting: Emergency Medicine

## 2015-10-07 ENCOUNTER — Encounter (HOSPITAL_COMMUNITY): Payer: Self-pay | Admitting: Emergency Medicine

## 2015-10-07 DIAGNOSIS — S61210A Laceration without foreign body of right index finger without damage to nail, initial encounter: Secondary | ICD-10-CM | POA: Insufficient documentation

## 2015-10-07 DIAGNOSIS — W268XXA Contact with other sharp object(s), not elsewhere classified, initial encounter: Secondary | ICD-10-CM | POA: Diagnosis not present

## 2015-10-07 DIAGNOSIS — Y93G3 Activity, cooking and baking: Secondary | ICD-10-CM | POA: Insufficient documentation

## 2015-10-07 DIAGNOSIS — I10 Essential (primary) hypertension: Secondary | ICD-10-CM | POA: Diagnosis not present

## 2015-10-07 DIAGNOSIS — J45909 Unspecified asthma, uncomplicated: Secondary | ICD-10-CM | POA: Diagnosis not present

## 2015-10-07 DIAGNOSIS — Z87891 Personal history of nicotine dependence: Secondary | ICD-10-CM | POA: Insufficient documentation

## 2015-10-07 DIAGNOSIS — Z86010 Personal history of colonic polyps: Secondary | ICD-10-CM | POA: Diagnosis not present

## 2015-10-07 DIAGNOSIS — Z7982 Long term (current) use of aspirin: Secondary | ICD-10-CM | POA: Diagnosis not present

## 2015-10-07 DIAGNOSIS — Y92009 Unspecified place in unspecified non-institutional (private) residence as the place of occurrence of the external cause: Secondary | ICD-10-CM | POA: Insufficient documentation

## 2015-10-07 DIAGNOSIS — Z79899 Other long term (current) drug therapy: Secondary | ICD-10-CM | POA: Insufficient documentation

## 2015-10-07 DIAGNOSIS — Y998 Other external cause status: Secondary | ICD-10-CM | POA: Insufficient documentation

## 2015-10-07 DIAGNOSIS — S61219A Laceration without foreign body of unspecified finger without damage to nail, initial encounter: Secondary | ICD-10-CM

## 2015-10-07 MED ORDER — LIDOCAINE HCL (PF) 1 % IJ SOLN
5.0000 mL | Freq: Once | INTRAMUSCULAR | Status: AC
  Administered 2015-10-08: 5 mL via INTRADERMAL
  Filled 2015-10-07: qty 5

## 2015-10-07 NOTE — ED Notes (Signed)
Pt. presents with laceration at right index finger sustained from a tin can while cooking at home this evening , dressing applied at triage .

## 2015-10-07 NOTE — ED Provider Notes (Signed)
CSN: [id number]     Arrival date & time 10/07/15  2219 History  By signing my name below, I, Natasha Bauer, attest that this documentation has been prepared under the direction and in the presence of Arthor CaptainAbigail Geralyn Figiel, PA-C.  Electronically Signed: Tanda RockersMargaux Bauer, ED Scribe. 10/07/2015. 10:36 PM.  Chief Complaint  Patient presents with  . Laceration   The history is provided by the patient. No language interpreter was used.     HPI Comments: Natasha Bauer is a 59 y.o. female who presents to the Emergency Department complaining of laceration to right index finger that occurred earlier tonight. Pt was cooking when she sliced her finger on a can. She notes mild pain to the area. Bleeding is controlled. Denies weakness, numbness, tingling, or any other associated symptoms. Tetanus up to date.    Past Medical History  Diagnosis Date  . Hypertension age 59  . Asthma   . Seasonal allergies   . Colon polyp     hyperplastic in 2004 and 2013   . Tenosynovitis, de Quervain     Dr. Amanda PeaGramig, resolved   Past Surgical History  Procedure Laterality Date  . Total abdominal hysterectomy w/ bilateral salpingoophorectomy  05/2003    fibroids  . Colonoscopy  08/20/12    Dr. Elnoria HowardHung (hyperplastic polyp)   Family History  Problem Relation Age of Onset  . Hypertension Father   . Cancer Mother [id number]    colon cancer  . Diabetes Mother   . Colon cancer Mother [id number]  . Arthritis Sister     rheumatoid arthritis  . Hypertension Sister   . Arthritis Brother     rheumatoid arthritis  . Heart disease Neg Hx    Social History  Substance Use Topics  . Smoking status: Former Smoker -- 0.00 packs/day for 8 years    Types: Cigarettes    Quit date: 12/12/1999  . Smokeless tobacco: Never Used  . Alcohol Use: No   OB History    Gravida Para Term Preterm AB TAB SAB Ectopic Multiple Living   0 0 0 0 0 0 0 0 0 0      Review of Systems  Musculoskeletal: Positive for arthralgias.  Skin: Positive for wound  (laceration to right index finger).  Neurological: Negative for weakness and numbness.      Allergies  Codeine; Penicillins; and Sinus & allergy  Home Medications   Prior to Admission medications   Medication Sig Start Date End Date Taking? Authorizing Provider  aspirin 81 MG tablet Take 81 mg by mouth daily.    Historical Provider, MD  fluticasone (FLONASE) 50 MCG/ACT nasal spray Place 2 sprays into both nostrils daily. Patient not taking: Reported on 11/23/2014 05/20/14 03/08/16  Joselyn ArrowEve Knapp, MD  lisinopril-hydrochlorothiazide (PRINZIDE,ZESTORETIC) 20-25 MG per tablet Take 1 tablet by mouth daily. 05/27/15   Joselyn ArrowEve Knapp, MD  montelukast (SINGULAIR) 10 MG tablet Take 1 tablet (10 mg total) by mouth at bedtime. 05/27/15 05/26/16  Joselyn ArrowEve Knapp, MD  Multiple Vitamins-Minerals (CENTRUM SILVER PO) Take 1 tablet by mouth daily.    Historical Provider, MD  VENTOLIN HFA 108 (90 BASE) MCG/ACT inhaler INHALE 2 PUFFS INTO THE LUNGS EVERY 6 HOURS AS NEEDED FOR WHEEZING OR SHORTNESS OF BREATH. 09/08/15   Joselyn ArrowEve Knapp, MD   Triage Vitals: BP 150/94 mmHg  Pulse 114  Temp(Src) 98.4 F (36.9 C) (Oral)  Resp 18  SpO2 98%   Physical Exam  Constitutional: She is oriented to person, place, and time. She appears well-developed and  well-nourished. No distress.  HENT:  Head: Normocephalic and atraumatic.  Eyes: Conjunctivae and EOM are normal.  Neck: Neck supple. No tracheal deviation present.  Cardiovascular: Normal rate.   Pulmonary/Chest: Effort normal. No respiratory distress.  Musculoskeletal: Normal range of motion.  2 cm superficial laceration of the R index finger  Neurological: She is alert and oriented to person, place, and time.  Skin: Skin is warm and dry.  Psychiatric: She has a normal mood and affect. Her behavior is normal.  Nursing note and vitals reviewed.   ED Course  Procedures (including critical care time)  DIAGNOSTIC STUDIES: Oxygen Saturation is 98% on RA, normal by my interpretation.     COORDINATION OF CARE: 11:12 PM-Discussed treatment plan which includes sutures with pt at bedside and pt agreed to plan.   Labs Review Labs Reviewed - No data to display  Imaging Review No results found.   EKG Interpretation None      LACERATION REPAIR Performed by: Arthor Captain Authorized by: Arthor Captain Consent: Verbal consent obtained. Risks and benefits: risks, benefits and alternatives were discussed Consent given by: patient Patient identity confirmed: provided demographic data Prepped and Draped in normal sterile fashion Wound explored  Laceration Location: Right index finger  Laceration Length: 2 cm  No Foreign Bodies seen or palpated  Anesthesia: local infiltration  Local anesthetic: lidocaine 1%  w/o epinephrine  Anesthetic total: 2 ml  Irrigation method: syringe Amount of cleaning: standard  Skin closure: plain gut  Number of sutures: 4  Technique: si  Patient tolerance: Patient tolerated the procedure well with no immediate complications.  MDM   Final diagnoses:  None   I personally performed the services described in this documentation, which was scribed in my presence. The recorded information has been reviewed and is accurate.    Tdap booster given.Pressure irrigation performed. Laceration occurred < 8 hours prior to repair which was well tolerated. Pt has no co morbidities to effect normal wound healing. Discussed suture home care w pt and answered questions.  Pt is hemodynamically stable w no complaints prior to dc.         Arthor Captain, PA-C 10/08/15 0020  Geoffery Lyons, MD 10/08/15 [phone]

## 2015-10-08 MED ORDER — TRAMADOL HCL 50 MG PO TABS: 50.0000 mg | ORAL_TABLET | Freq: Four times a day (QID) | ORAL | Status: DC | PRN

## 2015-10-08 NOTE — ED Notes (Signed)
Pt ambulating independently w/ steady gait on d/c in no acute distress, A&Ox4. D/c instructions reviewed w/ pt and family - pt and family deny any further questions or concerns at present. Rx given x1  

## 2015-10-08 NOTE — Discharge Instructions (Signed)

## 2015-11-24 ENCOUNTER — Other Ambulatory Visit: Payer: Self-pay | Admitting: Family Medicine

## 2015-12-08 ENCOUNTER — Ambulatory Visit (INDEPENDENT_AMBULATORY_CARE_PROVIDER_SITE_OTHER): Payer: 59 | Admitting: Family Medicine

## 2015-12-08 ENCOUNTER — Encounter: Payer: Self-pay | Admitting: Family Medicine

## 2015-12-08 VITALS — BP 136/82 | HR 80 | Temp 98.0°F | Ht 61.0 in | Wt 209.0 lb

## 2015-12-08 DIAGNOSIS — E669 Obesity, unspecified: Secondary | ICD-10-CM | POA: Diagnosis not present

## 2015-12-08 DIAGNOSIS — J4520 Mild intermittent asthma, uncomplicated: Secondary | ICD-10-CM | POA: Diagnosis not present

## 2015-12-08 DIAGNOSIS — J309 Allergic rhinitis, unspecified: Secondary | ICD-10-CM | POA: Diagnosis not present

## 2015-12-08 DIAGNOSIS — R599 Enlarged lymph nodes, unspecified: Secondary | ICD-10-CM | POA: Diagnosis not present

## 2015-12-08 DIAGNOSIS — I10 Essential (primary) hypertension: Secondary | ICD-10-CM | POA: Diagnosis not present

## 2015-12-08 DIAGNOSIS — R590 Localized enlarged lymph nodes: Secondary | ICD-10-CM

## 2015-12-08 DIAGNOSIS — E66812 Obesity, class 2: Secondary | ICD-10-CM

## 2015-12-08 NOTE — Progress Notes (Signed)
Chief Complaint  Patient presents with  . med check    doing well with meds no complaints, dealing with father's CHF recent hospitalization   Dad was discharged earlier this week--waking up frequently at night the last few nights since has been discharged. He lives with her sister.  She is doing okay.  Hypertension follow-up: Blood pressures elsewhere are 115-142/75-97.  Recently, frequently in the mid 130's/85-93. Denies dizziness, headaches, chest pain. Denies side effects of medications. Denies any change in diet--denies any high salt foods recently.  +stress.  Not getting any regular exercise, busy with two jobs.  Asthma/allergies: She is using the Singulair daily. She last used albuterol in September with season change. Her allergies have not been flaring recently, usually uses flonase in the spring.   Obesity: She continues to drink two 16 ounce bottles of Coke daily. She has had some decreased appetite, so sometimes still skipping meals.Her schedule hasn't allowed her to get back to the track to walk regularly.  Not getting any regular exercise.  She reports that the swollen gland in her left neck is much improved/smaller. Still getting dental work done, last in October, more planned; her dentist retired and is looking for a new one.  Denies pain.  PMH, PSH, SH reviewed.  Outpatient Encounter Prescriptions as of 12/08/2015  Medication Sig Note  . aspirin 81 MG tablet Take 81 mg by mouth daily.   Marland Kitchen. lisinopril-hydrochlorothiazide (PRINZIDE,ZESTORETIC) 20-25 MG tablet TAKE 1 TABLET BY MOUTH DAILY.   . montelukast (SINGULAIR) 10 MG tablet Take 1 tablet (10 mg total) by mouth at bedtime.   . Multiple Vitamins-Minerals (CENTRUM SILVER PO) Take 1 tablet by mouth daily.   . fluticasone (FLONASE) 50 MCG/ACT nasal spray Place 2 sprays into both nostrils daily. (Patient not taking: Reported on 11/23/2014) 11/23/2014: Uses seasonally, mainly in the Spring  . VENTOLIN HFA 108 (90 BASE)  MCG/ACT inhaler INHALE 2 PUFFS INTO THE LUNGS EVERY 6 HOURS AS NEEDED FOR WHEEZING OR SHORTNESS OF BREATH. (Patient not taking: Reported on 12/08/2015) 12/08/2015: Last used in September with season change  . [DISCONTINUED] traMADol (ULTRAM) 50 MG tablet Take 1 tablet (50 mg total) by mouth every 6 (six) hours as needed. (Patient not taking: Reported on 12/08/2015)    No facility-administered encounter medications on file as of 12/08/2015.   Allergies  Allergen Reactions  . Codeine Other (See Comments)    headache  . Penicillins Other (See Comments)    headache  . Sinus & Allergy [Pseudoephedrine] Other (See Comments)    Sinus medications do not work for her, they dry her up so badly that they give her asthma attacks.   ROS: no fever, chills, URI symptoms, chest pain, shortness of breath, GI complaints.  +stress/anxiety just recently.  Lost 4# since last visit six months ago.  PHYSICAL EXAM: BP 126/90 mmHg  Pulse 80  Temp(Src) 98 F (36.7 C) (Oral)  Ht 5\' 1"  (1.549 m)  Wt 209 lb (94.802 kg)  BMI 39.51 kg/m2  136/82 on repeat by MD, RA  Well developed, obese female in no distress. Tearful in discussing her stress and her dad. HEENT: PERRL, EOMI, conjunctiva clear.   Neck:  Supple, thyroid: very mildly enlarged; no carotid bruit or JVD. 1.5 cm nontender submaxillary lymph node is present on the left, mobile, nontender. Small superficial cyst on the lower right neck, nontender Heart: regular rate and rhythm Lungs: clear bilaterally Abdomen: soft, nontender, no organomegaly or mass Extremities: no edema, 2+ pulses Psych: tearful  intermittently, but exhibits full range.  Normal eye contact, speech Neuro: alert and oriented. Normal strength, gait, cranial nerves  ASSESSMENT/PLAN:  Essential hypertension, benign - overall controlled; higher recently with stress. stress reduction, exercise, weight loss, low sodium diet recommended  Asthma, mild intermittent, uncomplicated -  stable/controlled  Obesity, Class II, BMI 35-39.9  Allergic rhinitis, unspecified allergic rhinitis type - stable/controlled  Anterior cervical lymphadenopathy - essentially unchanged over 6 mos, therefore further eval recommended--she declines at this time.   Low sodium diet reviewed, healthy snacks in the house Regular exercise, how to incorporate into her busy schedule Stress reduction techniques reviewed.  Counseled more than 1/2 of 30 min visit.   Continue to monitor your blood pressure at home. Hopefully as your stress decreases, your BP gets back to your lower baseline; goal is <130/80; if consistently >135-140/85-90 then changes to your regimen will need to be made.  I truly think that lack of exercise, salt in the diet and stress are all contributing to some of the high numbers that we are seeing recently; you clearly had some excellent blood pressures recorded as well--I want to see more of those.  Have healthy snacks at home (fruit, carrot sticks, unsalted nuts and unsalted crackers).  Skip the chips. Try and avoid skipping meals--if you aren't hungry, at least have fruit or yogurt.  Skipping meals slows metabolism and makes it harder to lose weight.  You are getting a LOT of empty calories from your regular soda intake. Try and drink more water, and cut back on your soda. Goal is 64 ounces of WATER each day. Try and switch to a diet soda--switch flavors, such as mountain dew or Dr. Reino Kent (diet) so that you aren't as aware of the difference in flavor from the regular Coke.  Look into the programs available through [facility] ([facility]) and into their exercise/nutritional programs.  I am concerned about the persistently enlarged lymph node in your left neck--I would have expected this node to decrease in size after treatment of any dental infection.  It remains the same size as 6 months ago.  Next step is usually imaging (CT), ENT (ear/nose/throat) evaluation and/or  biopsy.  You indicated you were not ready to do anything at this point.  You don't need to wait until your next visit in 6 months to change your mind.  Let us know if/when you are ready to take the next step in evaluating this lump. Follow up with a new dentist, as planned; if related to infection and dental issues, I would expect this to resolve.  I'm concerned because it hasn't changed in 6 months.    F/u 6 months for CPE/med check (fasting)

## 2015-12-08 NOTE — Patient Instructions (Addendum)
  Continue to monitor your blood pressure at home. Hopefully as your stress decreases, your BP gets back to your lower baseline; goal is <130/80; if consistently >135-140/85-90 then changes to your regimen will need to be made.  I truly think that lack of exercise, salt in the diet and stress are all contributing to some of the high numbers that we are seeing recently; you clearly had some excellent blood pressures recorded as well--I want to see more of those.  Have healthy snacks at home (fruit, carrot sticks, unsalted nuts and unsalted crackers).  Skip the chips. Try and avoid skipping meals--if you aren't hungry, at least have fruit or yogurt.  Skipping meals slows metabolism and makes it harder to lose weight.  You are getting a LOT of empty calories from your regular soda intake. Try and drink more water, and cut back on your soda. Goal is 64 ounces of WATER each day. Try and switch to a diet soda--switch flavors, such as mountain dew or Dr. Reino KentPepper (diet) so that you aren't as aware of the difference in flavor from the regular Coke.   Look into the programs available through [facility] ([facility]) and into their exercise/nutritional programs.  I am concerned about the persistently enlarged lymph node in your left neck--I would have expected this node to decrease in size after treatment of any dental infection.  It remains the same size as 6 months ago.  Next step is usually imaging (CT), ENT (ear/nose/throat) evaluation and/or biopsy.  You indicated you were not ready to do anything at this point.  You don't need to wait until your next visit in 6 months to change your mind.  Let us know if/when you are ready to take the next step in evaluating this lump. Follow up with a new dentist, as planned; if related to infection and dental issues, I would expect this to resolve.  I'm concerned because it hasn't changed in 6 months.

## 2015-12-29 DIAGNOSIS — R109 Unspecified abdominal pain: Secondary | ICD-10-CM | POA: Diagnosis not present

## 2015-12-29 DIAGNOSIS — I10 Essential (primary) hypertension: Secondary | ICD-10-CM | POA: Diagnosis not present

## 2015-12-29 MED FILL — OLMESARTAN-HCTZ 20-12.5 MG: 20-12.5 | 30 days supply | Qty: 30 | Fill #0

## 2016-01-03 ENCOUNTER — Telehealth: Payer: Self-pay | Admitting: *Deleted

## 2016-01-03 NOTE — Telephone Encounter (Signed)
Patient called to let you know that after she last saw you she had some elevated BP readings. She went to an UC on [address] for this as you were not in that day. They changed her lisinopril 20/25 to Benicar 20/12.5mg  and she wanted to let you know. I updated her meds. She states that her numbers have been better. She will follow up with you if her numbers should increase. Just an FYI.

## 2016-01-03 NOTE — Telephone Encounter (Signed)
noted 

## 2016-01-27 MED FILL — OLMESARTAN-HCTZ 20-12.5 MG: 20-12.5 | 30 days supply | Qty: 30 | Fill #1

## 2016-02-24 MED FILL — OLMESARTAN-HCTZ 20-12.5 MG: 20-12.5 | 30 days supply | Qty: 30 | Fill #2

## 2016-02-28 MED FILL — HYDROCODON-APAP 7.5-325: 7.5-325 | 3 days supply | Qty: 12 | Fill #0

## 2016-02-28 MED FILL — CLINDAMYCIN HCL 150 MG CAPS: 150 | 10 days supply | Qty: 40 | Fill #0

## 2016-02-28 MED FILL — IBUPROFEN 800 MG TABLET: 800 | 8 days supply | Qty: 24 | Fill #0

## 2016-03-09 MED FILL — MONTELUKAST SOD 10 MG TAB: 10 | 90 days supply | Qty: 90 | Fill #3

## 2016-03-27 MED FILL — OLMESARTAN-HCTZ 20-12.5 MG: 20-12.5 | 30 days supply | Qty: 30 | Fill #3

## 2016-04-25 MED FILL — OLMESARTAN-HCTZ 20-12.5 MG: 20-12.5 | 30 days supply | Qty: 30 | Fill #0

## 2016-05-01 MED FILL — VENTOLIN HFA 90 MCG INHALER: 108 (90 BAS | 30 days supply | Qty: 18 | Fill #1

## 2016-05-23 ENCOUNTER — Other Ambulatory Visit: Payer: Self-pay | Admitting: Family Medicine

## 2016-05-23 MED FILL — OLMESARTAN-HCTZ 20-12.5 MG: 20-12.5 | 30 days supply | Qty: 30 | Fill #1

## 2016-06-01 MED FILL — MONTELUKAST SOD 10 MG TAB: 10 | 90 days supply | Qty: 90 | Fill #0

## 2016-06-15 ENCOUNTER — Ambulatory Visit (INDEPENDENT_AMBULATORY_CARE_PROVIDER_SITE_OTHER): Payer: 59 | Admitting: Family Medicine

## 2016-06-15 ENCOUNTER — Encounter: Payer: Self-pay | Admitting: Family Medicine

## 2016-06-15 VITALS — BP 154/94 | HR 72 | Ht 61.0 in | Wt 214.6 lb

## 2016-06-15 DIAGNOSIS — R599 Enlarged lymph nodes, unspecified: Secondary | ICD-10-CM | POA: Diagnosis not present

## 2016-06-15 DIAGNOSIS — J309 Allergic rhinitis, unspecified: Secondary | ICD-10-CM | POA: Diagnosis not present

## 2016-06-15 DIAGNOSIS — E041 Nontoxic single thyroid nodule: Secondary | ICD-10-CM

## 2016-06-15 DIAGNOSIS — I10 Essential (primary) hypertension: Secondary | ICD-10-CM | POA: Diagnosis not present

## 2016-06-15 DIAGNOSIS — Z1159 Encounter for screening for other viral diseases: Secondary | ICD-10-CM | POA: Diagnosis not present

## 2016-06-15 DIAGNOSIS — Z0000 Encounter for general adult medical examination without abnormal findings: Secondary | ICD-10-CM

## 2016-06-15 DIAGNOSIS — R590 Localized enlarged lymph nodes: Secondary | ICD-10-CM

## 2016-06-15 DIAGNOSIS — J4520 Mild intermittent asthma, uncomplicated: Secondary | ICD-10-CM

## 2016-06-15 LAB — POCT URINALYSIS DIPSTICK
Bilirubin, UA: NEGATIVE
Glucose, UA: NEGATIVE
KETONES UA: NEGATIVE
Nitrite, UA: NEGATIVE
PH UA: 6
Protein, UA: NEGATIVE
RBC UA: NEGATIVE
Spec Grav, UA: 1.015
Urobilinogen, UA: NEGATIVE

## 2016-06-15 LAB — COMPREHENSIVE METABOLIC PANEL
ALBUMIN: 4.1 g/dL (ref 3.6–5.1)
ALT: 23 U/L (ref 6–29)
AST: 23 U/L (ref 10–35)
Alkaline Phosphatase: 55 U/L (ref 33–130)
BILIRUBIN TOTAL: 0.3 mg/dL (ref 0.2–1.2)
BUN: 13 mg/dL (ref 7–25)
CO2: 27 mmol/L (ref 20–31)
Calcium: 9.6 mg/dL (ref 8.6–10.4)
Chloride: 102 mmol/L (ref 98–110)
Creat: 0.7 mg/dL (ref 0.50–1.05)
Glucose, Bld: 85 mg/dL (ref 65–99)
Potassium: 4.3 mmol/L (ref 3.5–5.3)
Sodium: 141 mmol/L (ref 135–146)
TOTAL PROTEIN: 7.2 g/dL (ref 6.1–8.1)

## 2016-06-15 LAB — LIPID PANEL
Cholesterol: 203 mg/dL — ABNORMAL HIGH (ref 125–200)
HDL: 104 mg/dL (ref >=46)
LDL Cholesterol: 82 mg/dL (ref <130)
Total CHOL/HDL Ratio: 2 Ratio (ref <=5.0)
Triglycerides: 85 mg/dL (ref <150)
VLDL: 17 mg/dL (ref <30)

## 2016-06-15 MED ORDER — OLMESARTAN MEDOXOMIL-HCTZ 40-12.5 MG PO TABS: 1.0000 | ORAL_TABLET | Freq: Every day | ORAL | Status: DC

## 2016-06-15 MED FILL — OLMESARTAN-HCTZ 40-12.5 MG: 40-12.5 | 30 days supply | Qty: 30 | Fill #0

## 2016-06-15 NOTE — Patient Instructions (Signed)
HEALTH MAINTENANCE RECOMMENDATIONS:  It is recommended that you get at least 30 minutes of aerobic exercise at least 5 days/week (for weight loss, you may need as much as 60-90 minutes). This can be any activity that gets your heart rate up. This can be divided in 10-15 minute intervals if needed, but try and build up your endurance at least once a week.  Weight bearing exercise is also recommended twice weekly.  Eat a healthy diet with lots of vegetables, fruits and fiber.  "Colorful" foods have a lot of vitamins (ie green vegetables, tomatoes, red peppers, etc).  Limit sweet tea, regular sodas and alcoholic beverages, all of which has a lot of calories and sugar.  Up to 1 alcoholic drink daily may be beneficial for women (unless trying to lose weight, watch sugars).  Drink a lot of water.  Calcium recommendations are 1200-1500 mg daily (1500 mg for postmenopausal women or women without ovaries), and vitamin D 1000 IU daily.  This should be obtained from diet and/or supplements (vitamins), and calcium should not be taken all at once, but in divided doses.  Monthly self breast exams and yearly mammograms for women over the age of 58 is recommended.  Sunscreen of at least SPF 30 should be used on all sun-exposed parts of the skin when outside between the hours of 10 am and 4 pm (not just when at beach or pool, but even with exercise, golf, tennis, and yard work!)  Use a sunscreen that says "broad spectrum" so it covers both UVA and UVB rays, and make sure to reapply every 1-2 hours.  Remember to change the batteries in your smoke detectors when changing your clock times in the spring and fall.  Use your seat belt every time you are in a car, and please drive safely and not be distracted with cell phones and texting while driving.   STOP THE REGULAR SODAS!!   Increase your speed and distance while walking on the track, and try to go at least 3 times/week. Continue to monitor your blood pressure.    Limit the salt in the diet (see below). You can finish the last week of pills that you have, but we are then increasing the dose of your blood pressure medication. Bring your list of blood pressures to your visit next month. Add some comments--if high, did you eat something salty, forget your pill.  Was it after exercise (these BP's are usually the lowest).   Low-Sodium Eating Plan Sodium raises blood pressure and causes water to be held in the body. Getting less sodium from food will help lower your blood pressure, reduce any swelling, and protect your heart, liver, and kidneys. We get sodium by adding salt (sodium chloride) to food. Most of our sodium comes from canned, boxed, and frozen foods. Restaurant foods, fast foods, and pizza are also very high in sodium. Even if you take medicine to lower your blood pressure or to reduce fluid in your body, getting less sodium from your food is important. WHAT IS MY PLAN? Most people should limit their sodium intake to 2,300 mg a day. Your health care provider recommends that you limit your sodium intake to __________ a day.  WHAT DO I NEED TO KNOW ABOUT THIS EATING PLAN? For the low-sodium eating plan, you will follow these general guidelines:  Choose foods with a % Daily Value for sodium of less than 5% (as listed on the food label).   Use salt-free seasonings or herbs instead  of table salt or sea salt.   Check with your health care provider or pharmacist before using salt substitutes.   Eat fresh foods.  Eat more vegetables and fruits.  Limit canned vegetables. If you do use them, rinse them well to decrease the sodium.   Limit cheese to 1 oz (28 g) per day.   Eat lower-sodium products, often labeled as "lower sodium" or "no salt added."  Avoid foods that contain monosodium glutamate (MSG). MSG is sometimes added to [place] food and some canned foods.  Check food labels (Nutrition Facts labels) on foods to learn how much sodium  is in one serving.  Eat more home-cooked food and less restaurant, buffet, and fast food.  When eating at a restaurant, ask that your food be prepared with less salt, or no salt if possible.  HOW DO I READ FOOD LABELS FOR SODIUM INFORMATION? The Nutrition Facts label lists the amount of sodium in one serving of the food. If you eat more than one serving, you must multiply the listed amount of sodium by the number of servings. Food labels may also identify foods as:  Sodium free--Less than 5 mg in a serving.  Very low sodium--35 mg or less in a serving.  Low sodium--140 mg or less in a serving.  Light in sodium--50% less sodium in a serving. For example, if a food that usually has 300 mg of sodium is changed to become light in sodium, it will have 150 mg of sodium.  Reduced sodium--25% less sodium in a serving. For example, if a food that usually has 400 mg of sodium is changed to reduced sodium, it will have 300 mg of sodium. WHAT FOODS CAN I EAT? Grains Low-sodium cereals, including oats, puffed wheat and rice, and shredded wheat cereals. Low-sodium crackers. Unsalted rice and pasta. Lower-sodium bread.  Vegetables Frozen or fresh vegetables. Low-sodium or reduced-sodium canned vegetables. Low-sodium or reduced-sodium tomato sauce and paste. Low-sodium or reduced-sodium tomato and vegetable juices.  Fruits Fresh, frozen, and canned fruit. Fruit juice.  Meat and Other Protein Products Low-sodium canned tuna and salmon. Fresh or frozen meat, poultry, seafood, and fish. Lamb. Unsalted nuts. Dried beans, peas, and lentils without added salt. Unsalted canned beans. Homemade soups without salt. Eggs.  Dairy Milk. Soy milk. Ricotta cheese. Low-sodium or reduced-sodium cheeses. Yogurt.  Condiments Fresh and dried herbs and spices. Salt-free seasonings. Onion and garlic powders. Low-sodium varieties of mustard and ketchup. Fresh or refrigerated horseradish. Lemon juice.  Fats and  Oils Reduced-sodium salad dressings. Unsalted butter.  Other Unsalted popcorn and pretzels.  The items listed above may not be a complete list of recommended foods or beverages. Contact your dietitian for more options. WHAT FOODS ARE NOT RECOMMENDED? Grains Instant hot cereals. Bread stuffing, pancake, and biscuit mixes. Croutons. Seasoned rice or pasta mixes. Noodle soup cups. Boxed or frozen macaroni and cheese. Self-rising flour. Regular salted crackers. Vegetables Regular canned vegetables. Regular canned tomato sauce and paste. Regular tomato and vegetable juices. Frozen vegetables in sauces. Salted Jamaica[place] fries. Olives. Rosita FirePickles. Relishes. Sauerkraut. Salsa. Meat and Other Protein Products Salted, canned, smoked, spiced, or pickled meats, seafood, or fish. Bacon, ham, sausage, hot dogs, corned beef, chipped beef, and packaged luncheon meats. Salt pork. Jerky. Pickled herring. Anchovies, regular canned tuna, and sardines. Salted nuts. Dairy Processed cheese and cheese spreads. Cheese curds. Blue cheese and cottage cheese. Buttermilk.  Condiments Onion and garlic salt, seasoned salt, table salt, and sea salt. Canned and packaged gravies. Worcestershire sauce. Tartar  sauce. Barbecue sauce. Teriyaki sauce. Soy sauce, including reduced sodium. Steak sauce. Fish sauce. Oyster sauce. Cocktail sauce. Horseradish that you find on the shelf. Regular ketchup and mustard. Meat flavorings and tenderizers. Bouillon cubes. Hot sauce. Tabasco sauce. Marinades. Taco seasonings. Relishes. Fats and Oils Regular salad dressings. Salted butter. Margarine. Ghee. Bacon fat.  Other Potato and tortilla chips. Corn chips and puffs. Salted popcorn and pretzels. Canned or dried soups. Pizza. Frozen entrees and pot pies.  The items listed above may not be a complete list of foods and beverages to avoid. Contact your dietitian for more information.   This information is not intended to replace advice given  to you by your health care provider. Make sure you discuss any questions you have with your health care provider.   Document Released: 05/19/2002 Document Revised: 12/18/2014 Document Reviewed: 10/01/2013 Elsevier Interactive Patient Education [employer]
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
## 2016-06-16 ENCOUNTER — Encounter: Payer: Self-pay | Admitting: Family Medicine

## 2016-06-16 LAB — HEPATITIS C ANTIBODY: HCV Ab: NEGATIVE

## 2016-06-16 LAB — TSH: TSH: 0.59 mIU/L

## 2016-07-03 ENCOUNTER — Telehealth: Payer: Self-pay | Admitting: *Deleted

## 2016-07-03 MED ORDER — OLMESARTAN MEDOXOMIL-HCTZ 40-25 MG PO TABS: 1.0000 | ORAL_TABLET | Freq: Every day | ORAL | 0 refills | Status: DC

## 2016-07-03 MED FILL — OLMESARTAN-HCTZ 40-25 MG TA: 40-25 | 30 days supply | Qty: 30 | Fill #0

## 2016-07-03 NOTE — Telephone Encounter (Signed)
Have her change to the 40/25 dose of Benicar (this is just raising the dose of the diuretic, which may be more effective in lowering her blood pressure--she previously took 25mg  of the HCTZ in the lisinopril HCT combo she used to be on, prior to [facility] UC visit). Please rx x 1 month.  If feeling poorly, she can schedule OV sooner, otherwise change dose and f/u as scheduled in August

## 2016-07-03 NOTE — Telephone Encounter (Signed)
Patient called and was switched from lisinopril 20/25 to Benicar 40/12.5mg  at last visit. Due to follow up on 07/24/16. She states that the new medication is making her feel uncomfortable. Feels a very slight pressure in her chest from time to time. Also states that her bp's are not coming down-last two readings were 137/99 and 158/103-please advise, thanks.

## 2016-07-03 NOTE — Telephone Encounter (Signed)
Spoke with patient and went over Dr.Knapp's instructions and called in new med to [facility].

## 2016-07-24 ENCOUNTER — Ambulatory Visit (INDEPENDENT_AMBULATORY_CARE_PROVIDER_SITE_OTHER): Payer: 59 | Admitting: Family Medicine

## 2016-07-24 ENCOUNTER — Encounter: Payer: Self-pay | Admitting: Family Medicine

## 2016-07-24 VITALS — BP 150/88 | HR 68 | Ht 61.0 in | Wt 210.0 lb

## 2016-07-24 DIAGNOSIS — R599 Enlarged lymph nodes, unspecified: Secondary | ICD-10-CM | POA: Diagnosis not present

## 2016-07-24 DIAGNOSIS — Z5181 Encounter for therapeutic drug level monitoring: Secondary | ICD-10-CM

## 2016-07-24 DIAGNOSIS — R590 Localized enlarged lymph nodes: Secondary | ICD-10-CM

## 2016-07-24 DIAGNOSIS — I10 Essential (primary) hypertension: Secondary | ICD-10-CM | POA: Diagnosis not present

## 2016-07-24 LAB — BASIC METABOLIC PANEL
BUN: 18 mg/dL (ref 7–25)
CHLORIDE: 103 mmol/L (ref 98–110)
CO2: 28 mmol/L (ref 20–31)
Calcium: 9.7 mg/dL (ref 8.6–10.4)
Creat: 0.91 mg/dL (ref 0.50–1.05)
Glucose, Bld: 86 mg/dL (ref 65–99)
POTASSIUM: 4.1 mmol/L (ref 3.5–5.3)
SODIUM: 140 mmol/L (ref 135–146)

## 2016-07-24 MED ORDER — OLMESARTAN MEDOXOMIL-HCTZ 40-25 MG PO TABS: 1.0000 | ORAL_TABLET | Freq: Every day | ORAL | 1 refills | Status: DC

## 2016-07-24 MED FILL — OLMESARTAN-HCTZ 40-25 MG TA: 40-25 | 90 days supply | Qty: 90 | Fill #0

## 2016-07-24 NOTE — Progress Notes (Signed)
Chief Complaint  Patient presents with  . Follow-up    5 week recheck on blood pressure.    Her Benicar HCT dose was increased to 40-12.5 at her physical last month. There was no improvement (see phone call), so dose was raised to 40-25 the end of July.  Since the dose change, her BP's have improved significantly.  Ranging from 115-135 (average mid-upper 120's)/79-89 (average 85). Denies side effects to the medication--no cough, muscle cramps. She is voiding more, but also drinking more water.  She has lost 4# since her last visit.  She has been working more (double shifts)--drinking less Coke, eating more fruit, eating more at the hospital (healthier than how she cooks at home), walking more and drinking more water. She "feels great!" Admits to cooking at home yesterday, pot roast with gravy, mac and cheese, and thinks that is why her BP was elevated today.  She didn't check it at home (to see if white coat vs related to higher sodium/fat meal).    PMH, PSH, SH reviewed  Outpatient Encounter Prescriptions as of 07/24/2016  Medication Sig Note  . aspirin 81 MG tablet Take 81 mg by mouth daily.   . montelukast (SINGULAIR) 10 MG tablet TAKE 1 TABLET BY MOUTH AT BEDTIME.   . Multiple Vitamins-Minerals (CENTRUM SILVER PO) Take 1 tablet by mouth daily.   Marland Kitchen. olmesartan-hydrochlorothiazide (BENICAR HCT) 40-25 MG tablet Take 1 tablet by mouth daily.   . [DISCONTINUED] olmesartan-hydrochlorothiazide (BENICAR HCT) 40-25 MG tablet Take 1 tablet by mouth daily.   . fluticasone (FLONASE) 50 MCG/ACT nasal spray Place 2 sprays into both nostrils daily. (Patient not taking: Reported on 11/23/2014) 11/23/2014: Uses seasonally, mainly in the Spring  . VENTOLIN HFA 108 (90 BASE) MCG/ACT inhaler INHALE 2 PUFFS INTO THE LUNGS EVERY 6 HOURS AS NEEDED FOR WHEEZING OR SHORTNESS OF BREATH. (Patient not taking: Reported on 12/08/2015) 06/15/2016: Uses prn in the Spring; hasn't needed recently   No facility-administered  encounter medications on file as of 07/24/2016.    Allergies  Allergen Reactions  . Codeine Other (See Comments)    headache  . Penicillins Other (See Comments)    headache  . Sinus & Allergy [Pseudoephedrine] Other (See Comments)    Sinus medications do not work for her, they dry her up so badly that they give her asthma attacks.   ROS: no fever, chills, headaches, dizziness, muscle cramps, chest pain, edema, URI symptoms or other concerns.  PHYSICAL EXAM: BP (!) 160/90   Pulse 68   Ht 5\' 1"  (1.549 m)   Wt 210 lb (95.3 kg)   BMI 39.68 kg/m   Repeat BP was 150/88 LA by MD  Well developed, pleasant female, in good spirits HEENT: PERRL, EOMI, conjunctiva clear Neck: firm left anterior cervical LN, nontender, unchanged Heart: regular rate and rhythm without murmur Lungs: clear bilaterally Extremities: no edema, normal pulses Neuro: alert and oriented, normal strength, gait Psych: normal mood, affect, hygiene and grooming  ASSESSMENT/PLAN:  Essential hypertension, benign - improved control per home numbers, elevated today. Continue current meds and monitoring, weight loss - Plan: Basic metabolic panel, olmesartan-hydrochlorothiazide (BENICAR HCT) 40-25 MG tablet  Medication monitoring encounter - Plan: Basic metabolic panel  Anterior cervical lymphadenopathy - discussed again, encouraged biopsy--she plans to discuss with dentist. I will re-address at her f/u, if she doesn't contact me sooner

## 2016-07-24 NOTE — Patient Instructions (Addendum)
Continue your current medication.  Your blood pressure at home is much improved, although it was high at today's visit.  Be sure to continue to limit sodium, get regular exercise, and continue with your weight loss.  Let us know if your blood pressure is consistently >140/90, or if you develop any side effects, dizziness or other concerns.  I am still concerned about the lymph node in the neck.  Please consider being referred to an ENT doctor for evaluation and biopsy/excision.

## 2016-09-07 MED FILL — MONTELUKAST SOD 10 MG TAB: 10 | 90 days supply | Qty: 90 | Fill #1

## 2016-09-27 ENCOUNTER — Other Ambulatory Visit: Payer: Self-pay | Admitting: Family Medicine

## 2016-09-27 DIAGNOSIS — J309 Allergic rhinitis, unspecified: Secondary | ICD-10-CM

## 2016-09-27 MED FILL — FLUTICASONE PROP 50 MCG SPR: 50 | 30 days supply | Qty: 16 | Fill #0

## 2016-10-27 MED FILL — OLMESARTAN-HCTZ 40-25 MG TA: 40-25 | 90 days supply | Qty: 90 | Fill #1

## 2016-11-01 DIAGNOSIS — Z1231 Encounter for screening mammogram for malignant neoplasm of breast: Secondary | ICD-10-CM | POA: Diagnosis not present

## 2016-11-01 LAB — HM MAMMOGRAPHY

## 2016-11-08 ENCOUNTER — Encounter: Payer: Self-pay | Admitting: *Deleted

## 2016-12-12 MED FILL — MONTELUKAST SOD 10 MG TAB: 10 | 90 days supply | Qty: 90 | Fill #2

## 2016-12-26 ENCOUNTER — Ambulatory Visit (HOSPITAL_COMMUNITY)
Admission: EM | Admit: 2016-12-26 | Discharge: 2016-12-26 | Disposition: A | Discharge status: Home / Self Care | Payer: 59 | Attending: Family Medicine | Admitting: Family Medicine

## 2016-12-26 ENCOUNTER — Encounter (HOSPITAL_COMMUNITY): Payer: Self-pay | Admitting: Family Medicine

## 2016-12-26 DIAGNOSIS — M542 Cervicalgia: Secondary | ICD-10-CM

## 2016-12-26 MED ORDER — NAPROXEN 500 MG PO TABS: 500.0000 mg | ORAL_TABLET | Freq: Two times a day (BID) | ORAL | 0 refills | Status: DC

## 2016-12-26 MED ORDER — CYCLOBENZAPRINE HCL 5 MG PO TABS: 5.0000 mg | ORAL_TABLET | Freq: Every day | ORAL | 0 refills | Status: DC
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
## 2016-12-26 NOTE — Discharge Instructions (Signed)
Cervical Sprain A cervical sprain is a stretch or tear in one or more of the tough, cord-like tissues that connect bones (ligaments) in the neck. Cervical sprains can range from mild to severe. Severe cervical sprains can cause the spinal bones (vertebrae) in the neck to be unstable. This can lead to spinal cord damage and can result in serious nervous system problems. The amount of time that it takes for a cervical sprain to get better depends on the cause and extent of the injury. Most cervical sprains heal in 4-6 weeks. What are the causes? Cervical sprains may be caused by an injury (trauma), such as from a motor vehicle accident, a fall, or sudden forward and backward whipping movement of the head and neck (whiplash injury). Mild cervical sprains may be caused by wear and tear over time, such as from poor posture, sitting in a chair that does not provide support, or looking up or down for long periods of time. What increases the risk? The following factors may make you more likely to develop this condition:  Participating in activities that have a high risk of trauma to the neck. These include contact sports, auto racing, gymnastics, and diving.  Taking risks when driving or riding in a motor vehicle, such as speeding.  Having osteoarthritis of the spine.  Having poor strength and flexibility of the neck.  A previous neck injury.  Having poor posture.  Spending a lot of time in certain positions that put stress on the neck, such as sitting at a computer for long periods of time. What are the signs or symptoms? Symptoms of this condition include:  Pain, soreness, stiffness, tenderness, swelling, or a burning sensation in the front, back, or sides of the neck.  Sudden tightening of neck muscles that you cannot control (muscle spasms).  Pain in the shoulders or upper back.  Limited ability to move the neck.  Headache.  Dizziness.  Nausea.  Vomiting.  Weakness, numbness, or  tingling in a hand or an arm. Symptoms may develop right away after injury, or they may develop over a few days. In some cases, symptoms may go away with treatment and return (recur) over time. How is this diagnosed? This condition may be diagnosed based on:  Your medical history.  Your symptoms.  Any recent injuries or known neck problems that you have, such as arthritis in the neck.  A physical exam.  Imaging tests, such as:  X-rays.  MRI.  CT scan. How is this treated? This condition is treated by resting and icing the injured area and doing physical therapy exercises. Depending on the severity of your condition, treatment may also include:  Keeping your neck in place (immobilized) for periods of time. This may be done using:  A cervical collar. This supports your chin and the back of your head.  A cervical traction device. This is a sling that holds up your head. This removes weight and pressure from your neck, and it may help to relieve pain.  Medicines that help to relieve pain and inflammation.  Medicines that help to relax your muscles (muscle relaxants).  Surgery. This is rare. Follow these instructions at home: If you have a cervical collar:   Wear it as told by your health care provider. Do not remove the collar unless instructed by your health care provider.  Ask your health care provider before you make any adjustments to your collar.  If you have long hair, keep it outside of the collar.    Ask your health care provider if you can remove the collar for cleaning and bathing. If you are allowed to remove the collar for cleaning or bathing:  Follow instructions from your health care provider about how to remove the collar safely.  Clean the collar by wiping it with mild soap and water and drying it completely.  If your collar has removable pads, remove them every 1-2 days and wash them by hand with soap and water. Let them air-dry completely before you put  them back in the collar.  Check your skin under the collar for irritation or sores. If you see any, tell your health care provider. Managing pain, stiffness, and swelling   If directed, use a cervical traction device as told by your health care provider.  If directed, apply heat to the affected area before you do your physical therapy or as often as told by your health care provider. Use the heat source that your health care provider recommends, such as a moist heat pack or a heating pad.  Place a towel between your skin and the heat source.  Leave the heat on for 20-30 minutes.  Remove the heat if your skin turns bright red. This is especially important if you are unable to feel pain, heat, or cold. You may have a greater risk of getting burned.  If directed, put ice on the affected area:  Put ice in a plastic bag.  Place a towel between your skin and the bag.  Leave the ice on for 20 minutes, 2-3 times a day. Activity   Do not drive while wearing a cervical collar. If you do not have a cervical collar, ask your health care provider if it is safe to drive while your neck heals.  Do not drive or use heavy machinery while taking prescription pain medicine or muscle relaxants, unless your health care provider approves.  Do not lift anything that is heavier than 10 lb (4.5 kg) until your health care provider tells you that it is safe.  Rest as directed by your health care provider. Avoid positions and activities that make your symptoms worse. Ask your health care provider what activities are safe for you.  If physical therapy was prescribed, do exercises as told by your health care provider or physical therapist. General instructions   Take over-the-counter and prescription medicines only as told by your health care provider.  Do not use any products that contain nicotine or tobacco, such as cigarettes and e-cigarettes. These can delay healing. If you need help quitting, ask your  health care provider.  Keep all follow-up visits as told by your health care provider or physical therapist. This is important. How is this prevented? To prevent a cervical sprain from happening again:  Use and maintain good posture. Make any needed adjustments to your workstation to help you use good posture.  Exercise regularly as directed by your health care provider or physical therapist.  Avoid risky activities that may cause a cervical sprain. Contact a health care provider if:  You have symptoms that get worse or do not get better after 2 weeks of treatment.  You have pain that gets worse or does not get better with medicine.  You develop new, unexplained symptoms.  You have sores or irritated skin on your neck from wearing your cervical collar. Get help right away if:  You have severe pain.  You develop numbness, tingling, or weakness in any part of your body.  You cannot move   a part of your body (you have paralysis).  You have neck pain along with:  Severe dizziness.  Headache. Summary  A cervical sprain is a stretch or tear in one or more of the tough, cord-like tissues that connect bones (ligaments) in the neck.  Cervical sprains may be caused by an injury (trauma), such as from a motor vehicle accident, a fall, or sudden forward and backward whipping movement of the head and neck (whiplash injury).  Symptoms may develop right away after injury, or they may develop over a few days.  This condition is treated by resting and icing the injured area and doing physical therapy exercises. This information is not intended to replace advice given to you by your health care provider. Make sure you discuss any questions you have with your health care provider. Document Released: 09/24/2007 Document Revised: 07/26/2016 Document Reviewed: 07/26/2016 [employer] Interactive Patient Education  2017 [employer]  
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
## 2016-12-27 MED FILL — NAPROXEN 500 MG TABLET: 500 | 10 days supply | Qty: 20 | Fill #0

## 2016-12-27 MED FILL — CYCLOBENZAPRINE 5 MG TABLET: 5 | 10 days supply | Qty: 10 | Fill #0
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
## 2017-01-29 ENCOUNTER — Ambulatory Visit (INDEPENDENT_AMBULATORY_CARE_PROVIDER_SITE_OTHER): Payer: 59 | Admitting: Family Medicine

## 2017-01-29 VITALS — BP 156/90 | HR 80 | Temp 99.3°F | Ht 61.0 in | Wt 213.0 lb

## 2017-01-29 DIAGNOSIS — J45909 Unspecified asthma, uncomplicated: Secondary | ICD-10-CM

## 2017-01-29 DIAGNOSIS — J302 Other seasonal allergic rhinitis: Secondary | ICD-10-CM

## 2017-01-29 DIAGNOSIS — I10 Essential (primary) hypertension: Secondary | ICD-10-CM | POA: Diagnosis not present

## 2017-01-29 MED ORDER — OLMESARTAN MEDOXOMIL-HCTZ 40-25 MG PO TABS: 1.0000 | ORAL_TABLET | Freq: Every day | ORAL | 1 refills | Status: DC

## 2017-01-29 MED FILL — OLMESARTAN-HCTZ 40-25 MG TA: 40-25 | 90 days supply | Qty: 90 | Fill #0

## 2017-01-29 NOTE — Patient Instructions (Addendum)
  Try and exercise at least 30 minutes 5 days/week (or 150 minutes/week) of aerobic exercise.  This can help improve moods, as well as blood pressure and weight. Look into whether you have an employee assistance program through your job for counseling--if your moods do not start to improve, consider returning here to discuss medications.  Continue your current medications. Continue to monitor your blood pressure at home.  If it is consistently >140/90, please return. Keep track of when/why they are high (ie put comments--high salt meal, stress, headache, etc). Hopefully, as you resume a regular exercise routine, and stressors improve, we will see the blood pressures be more consistently in the 120-130/80 range.  Don't skip meals--this slows your metabolism and makes it harder to lose weight.

## 2017-01-30 ENCOUNTER — Encounter: Payer: Self-pay | Admitting: Family Medicine

## 2017-02-19 ENCOUNTER — Other Ambulatory Visit: Payer: Self-pay | Admitting: Family Medicine

## 2017-02-19 MED FILL — VENTOLIN HFA 90 MCG INHALER: 108 (90 BAS | 30 days supply | Qty: 18 | Fill #0

## 2017-02-19 MED FILL — FLUTICASONE PROP 50 MCG SPR: 50 | 30 days supply | Qty: 16 | Fill #1

## 2017-02-19 NOTE — Telephone Encounter (Signed)
Is this okay to refill? 

## 2017-03-21 MED FILL — MONTELUKAST SOD 10 MG TAB: 10 | 90 days supply | Qty: 90 | Fill #3

## 2017-04-26 MED FILL — OLMESARTAN-HCTZ 40-25 MG TA: 40-25 | 90 days supply | Qty: 90 | Fill #1

## 2017-07-18 ENCOUNTER — Telehealth: Payer: Self-pay | Admitting: Family Medicine

## 2017-07-18 MED ORDER — MONTELUKAST SODIUM 10 MG PO TABS: 10.0000 mg | ORAL_TABLET | Freq: Every day | ORAL | 0 refills | Status: DC

## 2017-07-18 NOTE — Telephone Encounter (Signed)
Rec[facility]'d [facility] refill Montelukast 10mg 

## 2017-07-18 NOTE — Telephone Encounter (Signed)
Done

## 2017-07-24 ENCOUNTER — Other Ambulatory Visit: Payer: Self-pay | Admitting: Family Medicine

## 2017-07-24 DIAGNOSIS — I10 Essential (primary) hypertension: Secondary | ICD-10-CM

## 2017-07-24 MED FILL — OLMESARTAN-HCTZ 40-25 MG TA: 40-25 | 90 days supply | Qty: 90 | Fill #0

## 2017-07-26 MED FILL — MONTELUKAST SOD 10 MG TAB: 10 | 90 days supply | Qty: 90 | Fill #0

## 2017-08-27 NOTE — Progress Notes (Signed)
Chief Complaint  Patient presents with  . Annual Exam    fasting annual exam with pelvic exam. Prefers to schedule an eye exam at [facility] rather than do here today. No concerns.     Natasha Bauer is a 61 y.o. female who presents for a complete physical.  She has the following concerns:  Hypertension: Her Benicar HCT dose was increased to 40-25 about a year ago, and BP's improved. BP continue to fluctuate a lot. Some 119/82, 120's/80 (rarely, not as often as at her last visit), but very frequently are upper 130's-mid 140's/84-94.  Doesn't cook with salt. +sandiwiches (lunchmeat) frequently, rare chinese food, some chips. 2x/wk bacon, breakfast sausage. +french fries 2x/week.  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  She does notice that her blood pressure is better when she walks. Had to move dad to a different facility, still having some stress related to this.  MIL is happy at hers. She reports that we have verified the accuracy of her monitor in the past.  Denies side effects to the medication--no cough, muscle cramps.  Asthma/allergies: She is using the Singulair daily. She uses albuterol prn, about once every 2-3 months.  Usually needs it in the spring when allergies were flaring, not yet. Allergies have started flaring just recently, with watery eyes today.  No runny nose or sneezing.  Not taking any antihistamines.  Overall feels like her asthma and allergies are controlled.  Last spirometry was 06/2016.  Obesity: Hasn't been exercising due to working double shifts (6am-8pm); 2 days off/week, but just "crashes" due to fatigue. Husband is now also back to work, so her schedule is now better.  She is still air-frying her food (chicken, pork chop). Drinks one 16 oz bottle of Sprite daily (switched from Coke), drinking more water (gets a headache from aspartame). Hasn't tried other artificial sweeteners (ie  Steevia). She admits to skipping meals.  Anterior cervical lymphadenopathy--she denies any change in size.  She has declined further eval in the past, stable x years. Swelling in neck "goes up and down and I'm not worried about it". She is going to the dentist regularly, last in July, getting dental work done, but still needs more work done. She previously reported she has had this swollen gland for at least 5 years, and that it was present when she had the ultrasound (of her thyroid) in 2015.  Thyroid nodules--s/p ultrasound and biopsy in 2015. Denies changes, symptoms.  Immunization History  Administered Date(s) Administered  . Influenza Split 08/21/2012, 09/10/2013  . Influenza-Unspecified 09/10/2016  . Pneumococcal Polysaccharide-23 12/28/2010  . Tdap 04/09/2012   Last Pap smear: N/a (s/p hysterectomy for benign reasons)  Last mammogram: 10/2016 Last colonoscopy: 08/2012, hyperplastic polyp (Dr. Benson Norway). Last DEXA: never  Dentist: regularly now Ophtho:  Yearly Exercise: No exercise other than being on her feet at work  Normal vitamin D screen in the past (2014)  Past Medical History:  Diagnosis Date  . Asthma   . Colon polyp    hyperplastic in 2004 and 2013   . Hypertension age 79  . Seasonal allergies   . Tenosynovitis, de Quervain    Dr. Amedeo Plenty, resolved    Past Surgical History:  Procedure Laterality Date  . COLONOSCOPY  08/20/12   Dr. Benson Norway (hyperplastic polyp)  . LACERATION REPAIR Right 10/16   cut finger  . TOTAL ABDOMINAL HYSTERECTOMY W/ BILATERAL SALPINGOOPHORECTOMY  05/2003   fibroids  Social History   Social History  . Marital status: Married    Spouse name: N/A  . Number of children: 0  . Years of education: N/A   Occupational History  . food Hydrologist, and private caregiver [employer]  . [employer] (food service tech) [place] History Main Topics  . Smoking status: Former Smoker    Packs/day: 0.00    Years: 8.00     Types: Cigarettes    Quit date: 12/12/1999  . Smokeless tobacco: Never Used  . Alcohol use No     Comment: sip of wine at Christmas  . Drug use: No  . Sexual activity: Yes    Partners: Male    Birth control/ protection: Surgical   Other Topics Concern  . Not on file   Social History Narrative   Lives with her husband. No children.  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    Family History  Problem Relation Age of Onset  . Hypertension Father   . Congestive Heart Failure Father   . Heart disease Father   . Cancer Mother 44       colon cancer  . Diabetes Mother   . Colon cancer Mother 34  . Arthritis Sister        rheumatoid arthritis  . Hypertension Sister   . Heart disease Sister        CHF  . Arthritis Brother        rheumatoid arthritis    Outpatient Encounter Prescriptions as of 08/28/2017  Medication Sig Note  . aspirin 81 MG tablet Take 81 mg by mouth daily.   . montelukast (SINGULAIR) 10 MG tablet Take 1 tablet (10 mg total) by mouth at bedtime.   . Multiple Vitamins-Minerals (CENTRUM SILVER PO) Take 1 tablet by mouth daily.   Marland Kitchen olmesartan-hydrochlorothiazide (BENICAR HCT) 40-25 MG tablet TAKE 1 TABLET BY MOUTH DAILY.   Marland Kitchen amLODipine (NORVASC) 5 MG tablet Take 1/2 tablet by mouth once daily. After 2 weeks, increase to full tablet if BP remains over 130/80   . fluticasone (FLONASE) 50 MCG/ACT nasal spray PLACE 2 SPRAYS INTO BOTH NOSTRILS DAILY. (Patient not taking: Reported on 01/29/2017) 01/29/2017: Uses in the Spring  . VENTOLIN HFA 108 (90 Base) MCG/ACT inhaler INHALE 2 PUFFS INTO THE LUNGS EVERY 6 HOURS AS NEEDED FOR WHEEZING OR SHORTNESS OF BREATH. (Patient not taking: Reported on 08/28/2017)    No facility-administered encounter medications on file as of 08/28/2017.     Allergies  Allergen Reactions   . Codeine Other (See Comments)    headache  . Penicillins Other (See Comments)    headache  . Sinus & Allergy [Pseudoephedrine] Other (See Comments)    Sinus medications do not work for her, they dry her up so badly that they give her asthma attacks.    ROS: The patient denies anorexia, fever, headaches, vision changes, decreased hearing, ear pain, sore throat, breast concerns, chest pain, palpitations, dizziness, syncope, dyspnea on exertion, cough, swelling, nausea, vomiting, diarrhea, constipation, abdominal pain, melena, hematochezia, indigestion/heartburn, hematuria, incontinence, dysuria, vaginal bleeding, discharge, odor or itch, genital lesions, joint pains, numbness, tingling, weakness, tremor, suspicious skin lesions, depression, anxiety, abnormal bleeding/bruising, or enlarged lymph nodes.  Occasional sweats, tolerable, mostly just facial. Eyes watering today. See HPI   PHYSICAL EXAM:  BP Marland Kitchen)  166/92 (BP Location: Right Arm, Patient Position: Sitting, Cuff Size: Normal)   Pulse 84   Ht 5' 0.5" (1.537 m)   Wt 212 lb (96.2 kg)   BMI 40.72 kg/m   Wt Readings from Last 3 Encounters:  08/28/17 212 lb (96.2 kg)  01/29/17 213 lb (96.6 kg)  07/24/16 210 lb (95.3 kg)   166/88 on repeat by MD  General Appearance:   Alert, cooperative, no distress, appears stated age  Head:   Normocephalic, without obvious abnormality, atraumatic  Eyes:   PERRL, conjunctiva/corneas clear, EOM's intact, fundi   benign  Ears:   Normal TM's and external ear canals  Nose:  Nares normal, mucosa is pale, moderately edematous L>R, no erythema or drainage; no sinus tenderness  Throat:  Lips, mucosa, and tongue normal; teeth and gums normal  Neck:  Supple, thyroid: mildly enlarged, R>L, no dominant mass. no carotid bruit or JVD. 1.5-2 cm nontender submandibular mass is present on the left.This is mobile, nontender.  Unchanged in size.  Back:  Spine nontender, no  curvature, ROM normal, no CVA tenderness  Lungs:   Clear to auscultation bilaterally without wheezes, rales or ronchi; respirations unlabored  Chest Wall:   No tenderness or deformity  Heart:   Regular rate and rhythm, S1 and S2 normal, no murmur, rub  or gallop  Breast Exam:   No tenderness, masses, or nipple discharge or inversion. No axillary lymphadenopathy  Abdomen:   Soft, non-tender, nondistended, normoactive bowel sounds,   no masses, no hepatosplenomegaly. Obese. WHSS inferiorly  Genitalia:   Normal external genitalia without lesions. BUS and vagina normal; uterus is surgically absent. No adnexal masses or tenderness appreciable but exam is limited due to body habitus. No masses are appreciable, and no tenderness. Pap not performed  Rectal:   Normal tone, no masses or tenderness; guaiac negative stool  Extremities:  No clubbing, cyanosis or edema.   Pulses:  2+ and symmetric all extremities  Skin:  Skin color, texture, turgor normal, no rashes or lesions. .  Lymph nodes:  Supraclavicular, and axillary nodes normal. See neck exam above.  Neurologic:  CNII-XII intact, normal strength, sensation and gait; reflexes 2+ and symmetric throughout   Psych: Normal mood, affect, hygiene and grooming   Normal spirometry   ASSESSMENT/PLAN:  Annual physical exam - Plan: POCT Urinalysis DIP (Proadvantage Device), Comprehensive metabolic panel, Lipid panel, CBC with Differential/Platelet, TSH  Uncomplicated asthma, unspecified asthma severity, unspecified whether persistent - currently doing well; normal spiromety. Continue montelukast; albuterol prn - Plan: Spirometry with Graph  Morbid obesity, unspecified obesity type ([place]) - counseled re: not skipping meals, regular exercise, healthy diet, cutting out sugared/caloric beverages  Essential hypertension, benign - suboptimally controlled;  add in low dose amlodipine, titrate up in 2 wks if needed. Low sodium diet, exercise, wt loss discussed - Plan: Comprehensive metabolic panel, Lipid panel  Medication monitoring encounter - Plan: Comprehensive metabolic panel, CBC with Differential/Platelet  Need for influenza vaccination - Plan: Flu Vaccine QUAD 6+ mos PF IM (Fluarix Quad PF)   c-met, lipids, CBC, TSH Shingrix rec when available   Discussed monthly self breast exams and yearly mammograms; at least 30 minutes of aerobic activity at least 5 days/week, weight bearing exercise 2x/wk; proper sunscreen use reviewed; healthy diet, including goals of calcium and vitamin D intake and alcohol recommendations (less than or equal to 1 drink/day) reviewed; regular seatbelt use; changing batteries in smoke detectors. Immunization recommendations discussed--flu shot given; shingrix recommended, to check with insurance. Colonoscopy  recommendations reviewed--UTD. Given mother's h/o colon cancer in her 6's, I recommended she double check with GI  Now 5 years from last colonoscopy), if she doesn't receive notification.  Based on her mother's history of colon cancer, I feel she should continue to have q5 year colonoscopies (not 10 yr due to hyperplastic polyps, as pt believes to be true).  60 min visit total, face to face time, more than 1/2 spent counseling (re: new BP meds, low sodium diet, exercise, weight loss, etc).   Start the new blood pressure medication, amlodipine, at 1/2 tablet once daily.  Take 1/2 tablet for 2 weeks.  If your blood pressure is <130/80, keep it at the 1/2 tablet daily.  If it most often >130/80, then increase to the full pill.  Return in 6 week with your list of blood pressures.  Contact us sooner if having any problems with this medication.  This is to be taken in addition to the olmesartan-HCT. Continue to work on cutting back on sodium in the diet, and finding at least 30 minutes of exercise most days. These things  should also help lower the blood pressure.  Goal is <130/80.  Do not skip meals!

## 2017-08-28 ENCOUNTER — Encounter: Payer: Self-pay | Admitting: Family Medicine

## 2017-08-28 ENCOUNTER — Ambulatory Visit (INDEPENDENT_AMBULATORY_CARE_PROVIDER_SITE_OTHER): Payer: 59 | Admitting: Family Medicine

## 2017-08-28 VITALS — BP 166/92 | HR 84 | Ht 60.5 in | Wt 212.0 lb

## 2017-08-28 DIAGNOSIS — I10 Essential (primary) hypertension: Secondary | ICD-10-CM

## 2017-08-28 DIAGNOSIS — Z23 Encounter for immunization: Secondary | ICD-10-CM

## 2017-08-28 DIAGNOSIS — Z5181 Encounter for therapeutic drug level monitoring: Secondary | ICD-10-CM | POA: Diagnosis not present

## 2017-08-28 DIAGNOSIS — Z0000 Encounter for general adult medical examination without abnormal findings: Secondary | ICD-10-CM | POA: Diagnosis not present

## 2017-08-28 DIAGNOSIS — J45909 Unspecified asthma, uncomplicated: Secondary | ICD-10-CM | POA: Diagnosis not present

## 2017-08-28 LAB — POCT URINALYSIS DIP (PROADVANTAGE DEVICE)
BILIRUBIN UA: NEGATIVE
GLUCOSE UA: NEGATIVE mg/dL
Ketones, POC UA: NEGATIVE mg/dL
NITRITE UA: NEGATIVE
Protein Ur, POC: NEGATIVE mg/dL
RBC UA: NEGATIVE
SPECIFIC GRAVITY, URINE: 1.02
Urobilinogen, Ur: NEGATIVE
pH, UA: 6 (ref 5.0–8.0)

## 2017-08-28 MED ORDER — AMLODIPINE BESYLATE 5 MG PO TABS: ORAL_TABLET | ORAL | 1 refills | Status: DC

## 2017-08-28 MED FILL — AMLODIPINE BESYLATE 5 MG TA: 5 | 44 days supply | Qty: 30 | Fill #0

## 2017-08-28 NOTE — Patient Instructions (Addendum)
HEALTH MAINTENANCE RECOMMENDATIONS:  It is recommended that you get at least 30 minutes of aerobic exercise at least 5 days/week (for weight loss, you may need as much as 60-90 minutes). This can be any activity that gets your heart rate up. This can be divided in 10-15 minute intervals if needed, but try and build up your endurance at least once a week.  Weight bearing exercise is also recommended twice weekly.  Eat a healthy diet with lots of vegetables, fruits and fiber.  "Colorful" foods have a lot of vitamins (ie green vegetables, tomatoes, red peppers, etc).  Limit sweet tea, regular sodas and alcoholic beverages, all of which has a lot of calories and sugar.  Up to 1 alcoholic drink daily may be beneficial for women (unless trying to lose weight, watch sugars).  Drink a lot of water.  Calcium recommendations are 1200-1500 mg daily (1500 mg for postmenopausal women or women without ovaries), and vitamin D 1000 IU daily.  This should be obtained from diet and/or supplements (vitamins), and calcium should not be taken all at once, but in divided doses.  Monthly self breast exams and yearly mammograms for women over the age of 21 is recommended.  Sunscreen of at least SPF 30 should be used on all sun-exposed parts of the skin when outside between the hours of 10 am and 4 pm (not just when at beach or pool, but even with exercise, golf, tennis, and yard work!)  Use a sunscreen that says "broad spectrum" so it covers both UVA and UVB rays, and make sure to reapply every 1-2 hours.  Remember to change the batteries in your smoke detectors when changing your clock times in the spring and fall.  Use your seat belt every time you are in a car, and please drive safely and not be distracted with cell phones and texting while driving.  I recommend getting the new shingles vaccine (Shingrix). You will need to check with your insurance to see if it is covered, and if covered by Medicare Part D, you need to  get from the pharmacy rather than our office.  It is a series of 2 injections, spaced 2 months apart.  Please look into the programs available through [facility] for assistance with healthy living, weight loss, exercise, stress reduction, etc.  Please try to continue to work on cutting out regular soda in your diet. You can try using non-aspartame sweeteners.   Start the new blood pressure medication, amlodipine, at 1/2 tablet once daily.  Take 1/2 tablet for 2 weeks.  If your blood pressure is <130/80, keep it at the 1/2 tablet daily.  If it most often >130/80, then increase to the full pill.  Return in 6 week with your list of blood pressures.  Contact us sooner if having any problems with this medication.  This is to be taken in addition to the olmesartan-HCT. Continue to work on cutting back on sodium in the diet, and finding at least 30 minutes of exercise most days. These things should also help lower the blood pressure.  Goal is <130/80.  Do not skip meals!!  Please check with Dr. Elnoria Howard about when you are due for another colonoscopy.  I believe that you should get them every 5 years due to your mother's history of colon cancer.   DASH Eating Plan DASH stands for "Dietary Approaches to Stop Hypertension." The DASH eating plan is a healthy eating plan that has been shown to reduce high blood pressure (  hypertension). It may also reduce your risk for type 2 diabetes, heart disease, and stroke. The DASH eating plan may also help with weight loss. What are tips for following this plan? General guidelines  Avoid eating more than 2,300 mg (milligrams) of salt (sodium) a day. If you have hypertension, you may need to reduce your sodium intake to 1,500 mg a day.  Limit alcohol intake to no more than 1 drink a day for nonpregnant women and 2 drinks a day for men. One drink equals 12 oz of beer, 5 oz of wine, or 1 oz of hard liquor.  Work with your health care provider to maintain a healthy body weight or  to lose weight. Ask what an ideal weight is for you.  Get at least 30 minutes of exercise that causes your heart to beat faster (aerobic exercise) most days of the week. Activities may include walking, swimming, or biking.  Work with your health care provider or diet and nutrition specialist (dietitian) to adjust your eating plan to your individual calorie needs. Reading food labels  Check food labels for the amount of sodium per serving. Choose foods with less than 5 percent of the Daily Value of sodium. Generally, foods with less than 300 mg of sodium per serving fit into this eating plan.  To find whole grains, look for the word "whole" as the first word in the ingredient list. Shopping  Buy products labeled as "low-sodium" or "no salt added."  Buy fresh foods. Avoid canned foods and premade or frozen meals. Cooking  Avoid adding salt when cooking. Use salt-free seasonings or herbs instead of table salt or sea salt. Check with your health care provider or pharmacist before using salt substitutes.  Do not fry foods. Cook foods using healthy methods such as baking, boiling, grilling, and broiling instead.  Cook with heart-healthy oils, such as olive, canola, soybean, or sunflower oil. Meal planning   Eat a balanced diet that includes: ? 5 or more servings of fruits and vegetables each day. At each meal, try to fill half of your plate with fruits and vegetables. ? Up to 6-8 servings of whole grains each day. ? Less than 6 oz of lean meat, poultry, or fish each day. A 3-oz serving of meat is about the same size as a deck of cards. One egg equals 1 oz. ? 2 servings of low-fat dairy each day. ? A serving of nuts, seeds, or beans 5 times each week. ? Heart-healthy fats. Healthy fats called Omega-3 fatty acids are found in foods such as flaxseeds and coldwater fish, like sardines, salmon, and mackerel.  Limit how much you eat of the following: ? Canned or prepackaged foods. ? Food that  is high in trans fat, such as fried foods. ? Food that is high in saturated fat, such as fatty meat. ? Sweets, desserts, sugary drinks, and other foods with added sugar. ? Full-fat dairy products.  Do not salt foods before eating.  Try to eat at least 2 vegetarian meals each week.  Eat more home-cooked food and less restaurant, buffet, and fast food.  When eating at a restaurant, ask that your food be prepared with less salt or no salt, if possible. What foods are recommended? The items listed may not be a complete list. Talk with your dietitian about what dietary choices are best for you. Grains Whole-grain or whole-wheat bread. Whole-grain or whole-wheat pasta. Brown rice. Orpah Cobb. Bulgur. Whole-grain and low-sodium cereals. Pita bread. Low-fat, low-sodium  crackers. Whole-wheat flour tortillas. Vegetables Fresh or frozen vegetables (raw, steamed, roasted, or grilled). Low-sodium or reduced-sodium tomato and vegetable juice. Low-sodium or reduced-sodium tomato sauce and tomato paste. Low-sodium or reduced-sodium canned vegetables. Fruits All fresh, dried, or frozen fruit. Canned fruit in natural juice (without added sugar). Meat and other protein foods Skinless chicken or [place]. Ground chicken or [place]. Pork with fat trimmed off. Fish and seafood. Egg whites. Dried beans, peas, or lentils. Unsalted nuts, nut butters, and seeds. Unsalted canned beans. Lean cuts of beef with fat trimmed off. Low-sodium, lean deli meat. Dairy Low-fat (1%) or fat-free (skim) milk. Fat-free, low-fat, or reduced-fat cheeses. Nonfat, low-sodium ricotta or cottage cheese. Low-fat or nonfat yogurt. Low-fat, low-sodium cheese. Fats and oils Soft margarine without trans fats. Vegetable oil. Low-fat, reduced-fat, or light mayonnaise and salad dressings (reduced-sodium). Canola, safflower, olive, soybean, and sunflower oils. Avocado. Seasoning and other foods Herbs. Spices. Seasoning mixes without salt.  Unsalted popcorn and pretzels. Fat-free sweets. What foods are not recommended? The items listed may not be a complete list. Talk with your dietitian about what dietary choices are best for you. Grains Baked goods made with fat, such as croissants, muffins, or some breads. Dry pasta or rice meal packs. Vegetables Creamed or fried vegetables. Vegetables in a cheese sauce. Regular canned vegetables (not low-sodium or reduced-sodium). Regular canned tomato sauce and paste (not low-sodium or reduced-sodium). Regular tomato and vegetable juice (not low-sodium or reduced-sodium). Rosita Fire. Olives. Fruits Canned fruit in a light or heavy syrup. Fried fruit. Fruit in cream or butter sauce. Meat and other protein foods Fatty cuts of meat. Ribs. Fried meat. Tomasa Blase. Sausage. Bologna and other processed lunch meats. Salami. Fatback. Hotdogs. Bratwurst. Salted nuts and seeds. Canned beans with added salt. Canned or smoked fish. Whole eggs or egg yolks. Chicken or [place] with skin. Dairy Whole or 2% milk, cream, and half-and-half. Whole or full-fat cream cheese. Whole-fat or sweetened yogurt. Full-fat cheese. Nondairy creamers. Whipped toppings. Processed cheese and cheese spreads. Fats and oils Butter. Stick margarine. Lard. Shortening. Ghee. Bacon fat. Tropical oils, such as coconut, palm kernel, or palm oil. Seasoning and other foods Salted popcorn and pretzels. Onion salt, garlic salt, seasoned salt, table salt, and sea salt. Worcestershire sauce. Tartar sauce. Barbecue sauce. Teriyaki sauce. Soy sauce, including reduced-sodium. Steak sauce. Canned and packaged gravies. Fish sauce. Oyster sauce. Cocktail sauce. Horseradish that you find on the shelf. Ketchup. Mustard. Meat flavorings and tenderizers. Bouillon cubes. Hot sauce and Tabasco sauce. Premade or packaged marinades. Premade or packaged taco seasonings. Relishes. Regular salad dressings. Where to find more information:  National Heart, Lung, and Blood  Institute: PopSteam.is  American Heart Association: www.heart.org Summary  The DASH eating plan is a healthy eating plan that has been shown to reduce high blood pressure (hypertension). It may also reduce your risk for type 2 diabetes, heart disease, and stroke.  With the DASH eating plan, you should limit salt (sodium) intake to 2,300 mg a day. If you have hypertension, you may need to reduce your sodium intake to 1,500 mg a day.  When on the DASH eating plan, aim to eat more fresh fruits and vegetables, whole grains, lean proteins, low-fat dairy, and heart-healthy fats.  Work with your health care provider or diet and nutrition specialist (dietitian) to adjust your eating plan to your individual calorie needs. This information is not intended to replace advice given to you by your health care provider. Make sure you discuss any questions you have with your  health care provider. Document Released: 11/16/2011 Document Revised: 11/20/2016 Document Reviewed: 11/20/2016 Elsevier Interactive Patient Education  2017 [employer].

## 2017-08-29 ENCOUNTER — Encounter: Payer: 59 | Admitting: Family Medicine

## 2017-08-29 ENCOUNTER — Encounter: Payer: Self-pay | Admitting: Family Medicine

## 2017-08-29 LAB — LIPID PANEL
Cholesterol: 224 mg/dL — ABNORMAL HIGH (ref <200)
HDL: 102 mg/dL (ref >50)
LDL Cholesterol (Calc): 103 mg/dL (calc) — ABNORMAL HIGH
NON-HDL CHOLESTEROL (CALC): 122 mg/dL (ref <130)
TRIGLYCERIDES: 99 mg/dL (ref <150)
Total CHOL/HDL Ratio: 2.2 (calc) (ref <5.0)

## 2017-08-29 LAB — COMPREHENSIVE METABOLIC PANEL
AG RATIO: 1.5 (calc) (ref 1.0–2.5)
ALT: 18 U/L (ref 6–29)
AST: 21 U/L (ref 10–35)
Albumin: 4.3 g/dL (ref 3.6–5.1)
Alkaline phosphatase (APISO): 58 U/L (ref 33–130)
BILIRUBIN TOTAL: 0.3 mg/dL (ref 0.2–1.2)
BUN: 14 mg/dL (ref 7–25)
CALCIUM: 9.6 mg/dL (ref 8.6–10.4)
CHLORIDE: 101 mmol/L (ref 98–110)
CO2: 29 mmol/L (ref 20–32)
Creat: 0.77 mg/dL (ref 0.50–1.05)
GLOBULIN: 2.9 g/dL (ref 1.9–3.7)
Glucose, Bld: 96 mg/dL (ref 65–99)
Potassium: 4.1 mmol/L (ref 3.5–5.3)
SODIUM: 140 mmol/L (ref 135–146)
TOTAL PROTEIN: 7.2 g/dL (ref 6.1–8.1)

## 2017-08-29 LAB — CBC WITH DIFFERENTIAL/PLATELET
BASOS ABS: 37 {cells}/uL (ref 0–200)
BASOS PCT: 0.6 %
EOS ABS: 242 {cells}/uL (ref 15–500)
Eosinophils Relative: 3.9 %
HCT: 41.6 % (ref 35.0–45.0)
HEMOGLOBIN: 13.5 g/dL (ref 11.7–15.5)
Lymphs Abs: 1978 cells/uL (ref 850–3900)
MCH: 25.6 pg — AB (ref 27.0–33.0)
MCHC: 32.5 g/dL (ref 32.0–36.0)
MCV: 78.8 fL — AB (ref 80.0–100.0)
MONOS PCT: 7.9 %
MPV: 11.8 fL (ref 7.5–12.5)
Neutro Abs: 3453 cells/uL (ref 1500–7800)
Neutrophils Relative %: 55.7 %
PLATELETS: 219 10*3/uL (ref 140–400)
RBC: 5.28 10*6/uL — ABNORMAL HIGH (ref 3.80–5.10)
RDW: 15.1 % — ABNORMAL HIGH (ref 11.0–15.0)
TOTAL LYMPHOCYTE: 31.9 %
WBC: 6.2 10*3/uL (ref 3.8–10.8)
WBCMIX: 490 {cells}/uL (ref 200–950)

## 2017-08-29 LAB — TSH: TSH: 0.54 m[IU]/L (ref 0.40–4.50)

## 2017-10-09 NOTE — Progress Notes (Signed)
Chief Complaint  Patient presents with  . 6 week follow-up    6 week follow-up on bp   Patient presents for 6 weeks follow-up on hypertension. BP was noted to be high at her physical.  Amlodipine 5mg  was added to her olmesartan-HCT.  She was started on 1/2 tablet, and advised to increase after 2 weeks if BP remained >130/80.  Low sodium diet had been reviewed.  She took 1/2 tablet daily until last week.  Her BP's had been running 115-136/72-88.  She increased to the full tablet 6 days ago--she had a headache, felt a little more tired, BP was 140 that night. She has been taking full tablet since then, and her BP's have remained good--120-133/78-86.  She denies any side effects. Her energy has improved since taking this medication.    Exercise: None Drinks regular sprite daily. Admits to getting more carbs/starches and not enough other vegetables in her diet.  Doesn't feel like she eats a lot. Skips some meals.   PMH, PSH, SH reviewed  Outpatient Encounter Prescriptions as of 10/10/2017  Medication Sig Note  . amLODipine (NORVASC) 5 MG tablet Take 1 tablet (5 mg total) by mouth daily.   Marland Kitchen. aspirin 81 MG tablet Take 81 mg by mouth daily.   . fluticasone (FLONASE) 50 MCG/ACT nasal spray PLACE 2 SPRAYS INTO BOTH NOSTRILS DAILY. 01/29/2017: Uses in the Spring  . montelukast (SINGULAIR) 10 MG tablet Take 1 tablet (10 mg total) by mouth at bedtime.   . Multiple Vitamins-Minerals (CENTRUM SILVER PO) Take 1 tablet by mouth daily.   Marland Kitchen. olmesartan-hydrochlorothiazide (BENICAR HCT) 40-25 MG tablet Take 1 tablet by mouth daily.   . VENTOLIN HFA 108 (90 Base) MCG/ACT inhaler INHALE 2 PUFFS INTO THE LUNGS EVERY 6 HOURS AS NEEDED FOR WHEEZING OR SHORTNESS OF BREATH.   . [DISCONTINUED] amLODipine (NORVASC) 5 MG tablet Take 1/2 tablet by mouth once daily. After 2 weeks, increase to full tablet if BP remains over 130/80   . [DISCONTINUED] montelukast (SINGULAIR) 10 MG tablet Take 1 tablet (10 mg total) by mouth at  bedtime.   . [DISCONTINUED] olmesartan-hydrochlorothiazide (BENICAR HCT) 40-25 MG tablet TAKE 1 TABLET BY MOUTH DAILY.    No facility-administered encounter medications on file as of 10/10/2017.    (increased from 2.5 to 5mg  of amlodipine 6 days ago)  Allergies  Allergen Reactions  . Codeine Other (See Comments)    headache  . Penicillins Other (See Comments)    headache  . Sinus & Allergy [Pseudoephedrine] Other (See Comments)    Sinus medications do not work for her, they dry her up so badly that they give her asthma attacks.    ROS: no headaches, dizziness, chest pain, palpitations, shortness of breath, nausea, vomiting, bowel changes, edema.  Energy has improved. No URI complaints.  PHYSICAL EXAM:  BP 130/80   Pulse 96   Wt 211 lb 9.6 oz (96 kg)   BMI 40.65 kg/m   Wt Readings from Last 3 Encounters:  10/10/17 211 lb 9.6 oz (96 kg)  08/28/17 212 lb (96.2 kg)  01/29/17 213 lb (96.6 kg)   Pt somewhat excitable, laughing--BP went up to 154 systolic on repeat by MD  Well developed, pleasant, obese female in no distress HEENT: conjunctiva and sclera are clear, EOMI Neck: no lymphadenopathy, thyromegaly or mass Heart: regular rate and rhythm Lungs: clear bilaterally Abdomen: soft, nontender, no mass Extremities: no edema, normal pulses Psych: normal mood, affect, hygiene and grooming Neuro: alert and oriented, cranial  nerves intact, normal gait  ASSESSMENT/PLAN:  Essential hypertension, benign - continue 5mg  amlodipine in addition to olmesartan HCT (cut back only if BP persistently low; has been fine, only taking higher dose x 6d); exercise, wt loss - Plan: amLODipine (NORVASC) 5 MG tablet, olmesartan-hydrochlorothiazide (BENICAR HCT) 40-25 MG tablet  Uncomplicated asthma, unspecified asthma severity, unspecified whether persistent - doing well, needs refills - Plan: montelukast (SINGULAIR) 10 MG tablet  Obesity, morbid, BMI 40.0-49.9 (HCC) - counseled extensively re:  diet, exercise, weight loss. Seems motivated to make some dietary changes  25-30 min visit, more than 1/2 spent counseling.   F/u 6 months for med check   Continue both of your blood pressure medications, continuing with the full tablet of the amlodipine.  If down the line (with regular exercise and weight loss) if you start feeling lightheaded and see your blood pressure consistently around 100 systolic or lower, then you can start cutting the amlodipine in half again.  Be sure to try and work regular exercise into your daily routine, 150 minutes of aerobic exercise as we discussed (in at least 10 minute intervals where the heart rate remains elevated). Work on cutting back the carbs and starches in your diet, eating more fruit and less starchy vegetables.  Eat more salads, but limit the croutons, cheese and use a lowfat dressing. Don't skip meals--be sure to have at least a small snack if unable to eat a full meal. Try and cut out the sodas--drink more water, or switch to a diet soda.

## 2017-10-10 ENCOUNTER — Encounter: Payer: Self-pay | Admitting: Family Medicine

## 2017-10-10 ENCOUNTER — Ambulatory Visit (INDEPENDENT_AMBULATORY_CARE_PROVIDER_SITE_OTHER): Payer: 59 | Admitting: Family Medicine

## 2017-10-10 VITALS — BP 130/80 | HR 96 | Wt 211.6 lb

## 2017-10-10 DIAGNOSIS — J45909 Unspecified asthma, uncomplicated: Secondary | ICD-10-CM

## 2017-10-10 DIAGNOSIS — I10 Essential (primary) hypertension: Secondary | ICD-10-CM | POA: Diagnosis not present

## 2017-10-10 MED ORDER — MONTELUKAST SODIUM 10 MG PO TABS: 10.0000 mg | ORAL_TABLET | Freq: Every day | ORAL | 0 refills | Status: DC

## 2017-10-10 MED ORDER — AMLODIPINE BESYLATE 5 MG PO TABS: 5.0000 mg | ORAL_TABLET | Freq: Every day | ORAL | 1 refills | Status: DC

## 2017-10-10 MED ORDER — OLMESARTAN MEDOXOMIL-HCTZ 40-25 MG PO TABS: 1.0000 | ORAL_TABLET | Freq: Every day | ORAL | 0 refills | Status: DC

## 2017-10-10 MED FILL — MONTELUKAST SOD 10 MG TAB: 10 | 90 days supply | Qty: 90 | Fill #0

## 2017-10-10 MED FILL — OLMESARTAN-HCTZ 40-25 MG TA: 40-25 | 90 days supply | Qty: 90 | Fill #0

## 2017-10-10 MED FILL — AMLODIPINE BESYLATE 5 MG TA: 5 | 90 days supply | Qty: 90 | Fill #0

## 2017-10-10 NOTE — Patient Instructions (Addendum)
  Continue both of your blood pressure medications, continuing with the full tablet of the amlodipine.  If down the line (with regular exercise and weight loss) if you start feeling lightheaded and see your blood pressure consistently around 100 systolic or lower, then you can start cutting the amlodipine in half again.   Be sure to try and work regular exercise into your daily routine, 150 minutes of aerobic exercise as we discussed (in at least 10 minute intervals where the heart rate remains elevated). Work on cutting back the carbs and starches in your diet, eating more fruit and less starchy vegetables.  Eat more salads, but limit the croutons, cheese and use a lowfat dressing. Don't skip meals--be sure to have at least a small snack if unable to eat a full meal. Try and cut out the sodas--drink more water, or switch to a diet soda.

## 2017-10-19 MED FILL — VENTOLIN HFA 90 MCG INHALER: 108 (90 BAS | 30 days supply | Qty: 18 | Fill #1

## 2018-01-15 MED FILL — AMLODIPINE BESYLATE 5 MG TA: 5 | 90 days supply | Qty: 90 | Fill #1

## 2018-01-28 ENCOUNTER — Other Ambulatory Visit: Payer: Self-pay | Admitting: Family Medicine

## 2018-01-28 DIAGNOSIS — I10 Essential (primary) hypertension: Secondary | ICD-10-CM

## 2018-01-28 MED FILL — OLMESARTAN-HCTZ 40-25 MG TA: 40-25 | 90 days supply | Qty: 90 | Fill #0

## 2018-04-05 ENCOUNTER — Ambulatory Visit (HOSPITAL_COMMUNITY)
Admission: EM | Admit: 2018-04-05 | Discharge: 2018-04-05 | Disposition: A | Discharge status: Home / Self Care | Payer: 59 | Attending: Family Medicine | Admitting: Family Medicine

## 2018-04-05 ENCOUNTER — Encounter (HOSPITAL_COMMUNITY): Payer: Self-pay | Admitting: Emergency Medicine

## 2018-04-05 DIAGNOSIS — L0291 Cutaneous abscess, unspecified: Secondary | ICD-10-CM | POA: Diagnosis not present

## 2018-04-05 MED ORDER — DOXYCYCLINE HYCLATE 100 MG PO CAPS: 100.0000 mg | ORAL_CAPSULE | Freq: Two times a day (BID) | ORAL | 0 refills | Status: AC

## 2018-04-05 MED ORDER — IBUPROFEN 800 MG PO TABS: 800.0000 mg | ORAL_TABLET | Freq: Three times a day (TID) | ORAL | 0 refills | Status: DC

## 2018-04-05 MED FILL — DOXYCYCLINE HYCLATE 100 MG: 100 | 10 days supply | Qty: 20 | Fill #0

## 2018-04-05 MED FILL — IBUPROFEN 800 MG TAB: 800 | 7 days supply | Qty: 21 | Fill #0

## 2018-04-05 NOTE — Discharge Instructions (Addendum)
You may pull the packing out on Sunday at home or return here on Sunday for removal.   Begin doxycycline twice daily for the next 10 days.  Apply warm compresses to area with massage multiple times a day.  You may take Tylenol or ibuprofen to help with the pain.  Please return if pain, redness, swelling not improving with treatment.

## 2018-04-05 NOTE — ED Triage Notes (Signed)
Pt c/o L shoulder pain, states there was a boil on her shoulder and she pressed on it and a bunch of "yucky stuff came out of it".

## 2018-04-05 NOTE — ED Provider Notes (Signed)
[facility]    CSN: [id number] Arrival date & time: 04/05/18  1001     History   Chief Complaint Chief Complaint  Patient presents with  . Shoulder Pain  . Abscess    HPI Natasha Bauer is a 62 y.o. female history of hypertension presenting today for evaluation of shoulder pain/abscess.  Patient states that abscess has been there for approximately 5 to 6 days since Sunday.  Over the week it has been increasingly growing in size as well as redness and pain.  Patient presented today over concern for infection.  Denies history of recurrent abscesses.  Does endorse pain with moving her left shoulder, but denies difficulty moving.  HPI  Past Medical History:  Diagnosis Date  . Asthma   . Colon polyp    hyperplastic in 2004 and 2013   . Hypertension age 62  . Seasonal allergies   . Tenosynovitis, de Quervain    Dr. Amanda PeaGramig, resolved    Patient Active Problem List   Diagnosis Date Noted  . Severe obesity (BMI >= 40) (HCC) 05/27/2015  . Right thyroid nodule 05/22/2014  . Obesity, Class II, BMI 35-39.9 04/30/2013  . Essential hypertension, benign 07/31/2012  . Asthma 07/31/2012  . Allergic rhinitis 07/31/2012    Past Surgical History:  Procedure Laterality Date  . COLONOSCOPY  08/20/12   Dr. Elnoria HowardHung (hyperplastic polyp)  . LACERATION REPAIR Right 10/16   cut finger  . TOTAL ABDOMINAL HYSTERECTOMY W/ BILATERAL SALPINGOOPHORECTOMY  05/2003   fibroids    OB History    Gravida  0   Para  0   Term  0   Preterm  0   AB  0   Living  0     SAB  0   TAB  0   Ectopic  0   Multiple  0   Live Births               Home Medications    Prior to Admission medications   Medication Sig Start Date End Date Taking? Authorizing Provider  amLODipine (NORVASC) 5 MG tablet Take 1 tablet (5 mg total) by mouth daily. 10/10/17   Joselyn ArrowKnapp, Eve, MD  aspirin 81 MG tablet Take 81 mg by mouth daily.    [provider]  doxycycline (VIBRAMYCIN) 100 MG  capsule Take 1 capsule (100 mg total) by mouth 2 (two) times daily for 10 days. 04/05/18 04/15/18  Jakelin Taussig C, PA-C  fluticasone (FLONASE) 50 MCG/ACT nasal spray PLACE 2 SPRAYS INTO BOTH NOSTRILS DAILY. 09/27/16   Joselyn ArrowKnapp, Eve, MD  ibuprofen (ADVIL,MOTRIN) 800 MG tablet Take 1 tablet (800 mg total) by mouth 3 (three) times daily. 04/05/18   Adaleen Hulgan C, PA-C  montelukast (SINGULAIR) 10 MG tablet Take 1 tablet (10 mg total) by mouth at bedtime. 10/10/17   Joselyn ArrowKnapp, Eve, MD  Multiple Vitamins-Minerals (CENTRUM SILVER PO) Take 1 tablet by mouth daily.    [provider]  olmesartan-hydrochlorothiazide (BENICAR HCT) 40-25 MG tablet TAKE 1 TABLET BY MOUTH DAILY. 01/28/18   Joselyn ArrowKnapp, Eve, MD  VENTOLIN HFA 108 (90 Base) MCG/ACT inhaler INHALE 2 PUFFS INTO THE LUNGS EVERY 6 HOURS AS NEEDED FOR WHEEZING OR SHORTNESS OF BREATH. 02/19/17   Joselyn ArrowKnapp, Eve, MD    Family History Family History  Problem Relation Age of Onset  . Hypertension Father   . Congestive Heart Failure Father   . Heart disease Father   . Cancer Mother 9857  colon cancer  . Diabetes Mother   . Colon cancer Mother 44  . Arthritis Sister        rheumatoid arthritis  . Hypertension Sister   . Heart disease Sister        CHF  . Arthritis Brother        rheumatoid arthritis    Social History Social History   Tobacco Use  . Smoking status: Former Smoker    Packs/day: 0.00    Years: 8.00    Pack years: 0.00    Types: Cigarettes    Last attempt to quit: 12/12/1999    Years since quitting: 18.3  . Smokeless tobacco: Never Used  . Tobacco comment: +passive tobacco exposure from her husband  Substance Use Topics  . Alcohol use: No    Alcohol/week: 0.0 oz    Comment: sip of wine at Christmas  . Drug use: No     Allergies   Codeine; Penicillins; and Sinus & allergy [pseudoephedrine]   Review of Systems Review of Systems  Constitutional: Negative for fatigue and fever.  Respiratory: Negative for cough and  shortness of breath.   Cardiovascular: Negative for chest pain.  Gastrointestinal: Negative for abdominal pain, nausea and vomiting.  Musculoskeletal: Positive for arthralgias and myalgias.  Skin: Positive for color change.       abscess  Neurological: Negative for dizziness, weakness, light-headedness, numbness and headaches.     Physical Exam Triage Vital Signs ED Triage Vitals  Enc Vitals Group     BP 04/05/18 1011 (!) 164/97     Pulse Rate 04/05/18 1010 86     Resp 04/05/18 1010 18     Temp 04/05/18 1010 98.1 F (36.7 C)     Temp src --      SpO2 04/05/18 1010 98 %     Weight --      Height --      Head Circumference --      Peak Flow --      Pain Score --      Pain Loc --      Pain Edu? --      Excl. in GC? --    No data found.  Updated Vital Signs BP (!) 164/97   Pulse 86   Temp 98.1 F (36.7 C)   Resp 18   SpO2 98%   Visual Acuity Right Eye Distance:   Left Eye Distance:   Bilateral Distance:    Right Eye Near:   Left Eye Near:    Bilateral Near:     Physical Exam  Constitutional: She appears well-developed and well-nourished. No distress.  HENT:  Head: Normocephalic and atraumatic.  Eyes: Conjunctivae are normal.  Neck: Neck supple.  Cardiovascular: Normal rate and regular rhythm.  No murmur heard. Pulmonary/Chest: Effort normal. No respiratory distress.  Musculoskeletal: She exhibits no edema.  Neurological: She is alert.  Skin: Skin is warm and dry.  Large 2 cm x 2 cm abscess with overlying erythema and fluctuance to superior aspect of left shoulder.  Psychiatric: She has a normal mood and affect.  Nursing note and vitals reviewed.    UC Treatments / Results  Labs (all labs ordered are listed, but only abnormal results are displayed) Labs Reviewed - No data to display  EKG None Radiology No results found.  Procedures Incision and Drainage Date/Time: 04/05/2018 10:48 AM Performed by: Jennilyn Esteve, Junius Creamer, PA-C Authorized by:  Elvina Sidle, MD   Consent:    Consent obtained:  Verbal   Consent given by:  Patient   Risks discussed:  Bleeding and incomplete drainage   Alternatives discussed:  No treatment Location:    Type:  Abscess   Size:  2   Location:  Upper extremity   Upper extremity location:  Shoulder   Shoulder location:  L shoulder Pre-procedure details:    Skin preparation:  Betadine Anesthesia (see MAR for exact dosages):    Anesthesia method:  Local infiltration   Local anesthetic:  Lidocaine 2% WITH epi Procedure type:    Complexity:  Simple Procedure details:    Needle aspiration: no     Incision types:  Single straight   Incision depth:  Subcutaneous   Scalpel blade:  11   Wound management:  Probed and deloculated   Drainage:  Purulent and bloody   Drainage amount:  Copious   Wound treatment:  Wound left open   Packing materials:  1/4 in iodoform gauze Post-procedure details:    Patient tolerance of procedure:  Tolerated well, no immediate complications   (including critical care time)  Medications Ordered in UC Medications - No data to display   Initial Impression / Assessment and Plan / UC Course  I have reviewed the triage vital signs and the nursing notes.  Pertinent labs & imaging results that were available during my care of the patient were reviewed by me and considered in my medical decision making (see chart for details).     Abscess I&D to, no complications.  Packing to be removed in 2 days either here at home.  Begin doxycycline.  Warm compresses. Discussed strict return precautions. Patient verbalized understanding and is agreeable with plan.   Final Clinical Impressions(s) / UC Diagnoses   Final diagnoses:  Abscess    ED Discharge Orders        Ordered    doxycycline (VIBRAMYCIN) 100 MG capsule  2 times daily     04/05/18 1045    ibuprofen (ADVIL,MOTRIN) 800 MG tablet  3 times daily     04/05/18 1045       Controlled Substance Prescriptions [place]  Controlled Substance Registry consulted? Not Applicable   Lew Dawes, [place] 04/05/18 1050

## 2018-04-09 NOTE — Progress Notes (Signed)
Patient presents for med check.  She went to [facility] 4/26 with abscess on her left shoulder. It was incised, drained, packed, and she was put on doxycycline. Packing came out Sunday, still feels a little sore.  Drainage has :"slacked up".  Tolerating the antibiotics without side effects.  Hypertension: BP was noted to be high at her physical in September, so Amlodipine 31m was added to her olmesartan-HCT. Her BP's had been running 118-146/80-90, mostly 120-130. BP's started increasing in late April, when she developed pain/abscess, but has been improved since treatment.  123/84 this morning, 118/79 yesterday. She denies any side effects. She denies headaches, dizziness, chest pain, palpitations, muscle cramps. She tries to follow low sodium diet. Sees 140/90 on days she eats more salt. Eating more fruits, vegetables, cutting back on soft drinks (still has 1 ginger ale or [place]daily). Cut out cookies and cakes, getting sugar from the soda instead. Cut out a lot of salt in her diet (previously noted to have a lot of lunchmeat, sausage, french fries, chips, bacon.)  Asthma/allergies: She is using the Singulair daily. She uses albuterol prn, about once every 2-3 months (last early in March, related to allergies). Usually needs it in thespring when allergies flare.  Overall feels like her asthma and allergies are controlled.  Last spirometry was normal in 08/2017.  Obesity: She is still air-frying her food (chicken, pork chop). Drinks one 16 oz bottle of ginger ale or SO[place]ot(switched from [employer], drinking more water (gets a headache from aspartame).  She is tolerating Splenda in her coffee.  No longer skipping meals, eating small meals more frequently.   Exercise: walking in the neighborhood 2x/week for 15-20 mins.  She has a stationery bike, 30 minutes/d three times/week.    Anterior cervical lymphadenopathy/mass--she denies any change in size. She hasdeclined further eval in the past,  stable x years. Swelling in neck "goes up and down and I'm not worried about it". She is going to the dentist regularly. She previously reported she has had this swollen gland for at least 5 years, and that it was present when she had the ultrasound (of her thyroid) in 2015.  Thyroid nodules--s/p ultrasound and biopsy in 2015. Denies changes, symptoms.  PMH, PSH, SH reviewed  Outpatient Encounter Medications as of 04/10/2018  Medication Sig Note  . amLODipine (NORVASC) 5 MG tablet Take 1 tablet (5 mg total) by mouth daily.   .Marland Kitchenaspirin 81 MG tablet Take 81 mg by mouth daily.   .Marland Kitchendoxycycline (VIBRAMYCIN) 100 MG capsule Take 1 capsule (100 mg total) by mouth 2 (two) times daily for 10 days.   . fluticasone (FLONASE) 50 MCG/ACT nasal spray PLACE 2 SPRAYS INTO BOTH NOSTRILS DAILY. 01/29/2017: Uses in the Spring  . ibuprofen (ADVIL,MOTRIN) 800 MG tablet Take 1 tablet (800 mg total) by mouth 3 (three) times daily.   . montelukast (SINGULAIR) 10 MG tablet Take 1 tablet (10 mg total) by mouth at bedtime.   . Multiple Vitamins-Minerals (CENTRUM SILVER PO) Take 1 tablet by mouth daily.   .Marland Kitchenolmesartan-hydrochlorothiazide (BENICAR HCT) 40-25 MG tablet Take 1 tablet by mouth daily.   . [DISCONTINUED] amLODipine (NORVASC) 5 MG tablet Take 1 tablet (5 mg total) by mouth daily.   . [DISCONTINUED] montelukast (SINGULAIR) 10 MG tablet Take 1 tablet (10 mg total) by mouth at bedtime.   . [DISCONTINUED] olmesartan-hydrochlorothiazide (BENICAR HCT) 40-25 MG tablet TAKE 1 TABLET BY MOUTH DAILY.   . VENTOLIN HFA 108 (90 Base) MCG/ACT  inhaler INHALE 2 PUFFS INTO THE LUNGS EVERY 6 HOURS AS NEEDED FOR WHEEZING OR SHORTNESS OF BREATH. (Patient not taking: Reported on 04/10/2018)    No facility-administered encounter medications on file as of 04/10/2018.    Allergies  Allergen Reactions  . Codeine Other (See Comments)    headache  . Penicillins Other (See Comments)    headache  . Sinus & Allergy [Pseudoephedrine] Other  (See Comments)    Sinus medications do not work for her, they dry her up so badly that they give her asthma attacks.   ROS: no fever, chills, URI symptoms, headaches, dizziness, chest pain, GI complaints, bleeding, bruising, rash.  Moods are good, energy is better.   PHYSICAL EXAM:  BP 128/88   Pulse 76   Ht 5' 0.5" (1.537 m)   Wt 208 lb (94.3 kg)   BMI 39.95 kg/m    134/78 on repeat by MD  Wt Readings from Last 3 Encounters:  04/10/18 208 lb (94.3 kg)  10/10/17 211 lb 9.6 oz (96 kg)  08/28/17 212 lb (96.2 kg)    Well developed, pleasant, obese female in no distress HEENT: conjunctiva and sclera are clear, EOMI Neck: mild diffuse enlargement of thyroid, more nodular on right. 2 x 1.5cm smooth mass in left anterior neck, nontender, not fixed Heart: regular rate and rhythm Lungs: clear bilaterally Abdomen: soft, nontender, no mass Back: no CVA or spinal tenderness Extremities: no edema, normal pulses Psych: normal mood, affect, hygiene and grooming Neuro: alert and oriented, cranial nerves intact, normal gait Skin: top of left shoulder--soft tissue swelling, that is soft (not indurated), hyperpigmented. There is a 50m open area with slight drainage only at the opening.  No purulence able to be expressed, minimally tender.    ASSESSMENT/PLAN:  Essential hypertension, benign - controlled; encouraged continued weight loss, low sodium diet. Cont current meds - Plan: amLODipine (NORVASC) 5 MG tablet, olmesartan-hydrochlorothiazide (BENICAR HCT) 40-25 MG tablet  Uncomplicated asthma, unspecified asthma severity, unspecified whether persistent - continue current meds - Plan: montelukast (SINGULAIR) 10 MG tablet  Right thyroid nodule - due for f/u ultrasound.  no change per pt - Plan: [place] Head/Neck  Anterior cervical lymphadenopathy - unchanged, persistent.  check [place](may need CT, if warranted/rec based on [place]ults) - Plan: [place] Head/Neck  Severe obesity  (BMI 35.0-39.9) with comorbidity ([place] - counseled re: exercise, diet, continued weight loss    Fu 6 mos for CPE with fasting labs prior CBC, c-met, lipid, TSH   Try sodas sweetened with Steevia instead of regular soda.

## 2018-04-10 ENCOUNTER — Encounter: Payer: Self-pay | Admitting: Family Medicine

## 2018-04-10 ENCOUNTER — Ambulatory Visit (INDEPENDENT_AMBULATORY_CARE_PROVIDER_SITE_OTHER): Payer: 59 | Admitting: Family Medicine

## 2018-04-10 VITALS — BP 134/78 | HR 76 | Ht 60.5 in | Wt 208.0 lb

## 2018-04-10 DIAGNOSIS — E041 Nontoxic single thyroid nodule: Secondary | ICD-10-CM | POA: Diagnosis not present

## 2018-04-10 DIAGNOSIS — J45909 Unspecified asthma, uncomplicated: Secondary | ICD-10-CM

## 2018-04-10 DIAGNOSIS — I10 Essential (primary) hypertension: Secondary | ICD-10-CM | POA: Diagnosis not present

## 2018-04-10 DIAGNOSIS — R590 Localized enlarged lymph nodes: Secondary | ICD-10-CM | POA: Diagnosis not present

## 2018-04-10 MED ORDER — OLMESARTAN MEDOXOMIL-HCTZ 40-25 MG PO TABS: 1.0000 | ORAL_TABLET | Freq: Every day | ORAL | 1 refills | Status: DC

## 2018-04-10 MED ORDER — AMLODIPINE BESYLATE 5 MG PO TABS: 5.0000 mg | ORAL_TABLET | Freq: Every day | ORAL | 1 refills | Status: DC

## 2018-04-10 MED ORDER — MONTELUKAST SODIUM 10 MG PO TABS: 10.0000 mg | ORAL_TABLET | Freq: Every day | ORAL | 1 refills | Status: DC

## 2018-04-10 MED FILL — AMLODIPINE BESYLATE 5 MG TA: 5 | 90 days supply | Qty: 90 | Fill #0

## 2018-04-10 MED FILL — MONTELUKAST SOD 10 MG TAB: 10 | 90 days supply | Qty: 90 | Fill #0

## 2018-04-26 MED FILL — OLMESARTAN-HCTZ 40-25 MG TA: 40-25 | 90 days supply | Qty: 90 | Fill #0

## 2018-05-20 ENCOUNTER — Encounter (HOSPITAL_COMMUNITY): Payer: Self-pay | Admitting: Emergency Medicine

## 2018-05-20 ENCOUNTER — Ambulatory Visit (HOSPITAL_COMMUNITY)
Admission: EM | Admit: 2018-05-20 | Discharge: 2018-05-20 | Disposition: A | Discharge status: Home / Self Care | Payer: 59 | Attending: Urgent Care | Admitting: Urgent Care

## 2018-05-20 DIAGNOSIS — R059 Cough, unspecified: Secondary | ICD-10-CM

## 2018-05-20 DIAGNOSIS — J9801 Acute bronchospasm: Secondary | ICD-10-CM

## 2018-05-20 DIAGNOSIS — R0789 Other chest pain: Secondary | ICD-10-CM

## 2018-05-20 DIAGNOSIS — R0602 Shortness of breath: Secondary | ICD-10-CM

## 2018-05-20 DIAGNOSIS — J4530 Mild persistent asthma, uncomplicated: Secondary | ICD-10-CM

## 2018-05-20 DIAGNOSIS — J3089 Other allergic rhinitis: Secondary | ICD-10-CM | POA: Diagnosis not present

## 2018-05-20 DIAGNOSIS — R05 Cough: Secondary | ICD-10-CM

## 2018-05-20 MED ORDER — BENZONATATE 100 MG PO CAPS: 100.0000 mg | ORAL_CAPSULE | Freq: Three times a day (TID) | ORAL | 0 refills | Status: DC | PRN

## 2018-05-20 MED ORDER — PREDNISONE 10 MG PO TABS: 40.0000 mg | ORAL_TABLET | Freq: Every day | ORAL | 0 refills | Status: DC

## 2018-05-20 MED ORDER — ALBUTEROL SULFATE HFA 108 (90 BASE) MCG/ACT IN AERS
INHALATION_SPRAY | RESPIRATORY_TRACT | 1 refills | Status: DC
Start: 2018-05-20 — End: 2020-10-13

## 2018-05-20 NOTE — ED Triage Notes (Signed)
Pt c/o chills, body aches, cough, nasal congestion, sore throat x4 days.

## 2018-05-20 NOTE — ED Provider Notes (Signed)
MRN: [id number] DOB: [birth year]  Subjective:   Natasha Bauer is a 62 y.o. female presenting for 4-day history of productive cough that elicits shortness of breath, wheezing, chest pain.  Patient also has severe allergies, reports that symptoms started after she spent a day working out in her yard and has since had sinus pain, sinus congestion, bilateral ear popping and fullness, throat pain.  She also has asthma and is not on inhaled steroid but she does use Singulair.  Has been using her albuterol inhaler once every 6 hours for the past few days.  Denies smoking cigarettes.  No current facility-administered medications for this encounter.   Current Outpatient Medications:  .  amLODipine (NORVASC) 5 MG tablet, Take 1 tablet (5 mg total) by mouth daily., Disp: 90 tablet, Rfl: 1 .  aspirin 81 MG tablet, Take 81 mg by mouth daily., Disp: , Rfl:  .  fluticasone (FLONASE) 50 MCG/ACT nasal spray, PLACE 2 SPRAYS INTO BOTH NOSTRILS DAILY., Disp: 16 g, Rfl: 4 .  ibuprofen (ADVIL,MOTRIN) 800 MG tablet, Take 1 tablet (800 mg total) by mouth 3 (three) times daily., Disp: 21 tablet, Rfl: 0 .  montelukast (SINGULAIR) 10 MG tablet, Take 1 tablet (10 mg total) by mouth at bedtime., Disp: 90 tablet, Rfl: 1 .  Multiple Vitamins-Minerals (CENTRUM SILVER PO), Take 1 tablet by mouth daily., Disp: , Rfl:  .  olmesartan-hydrochlorothiazide (BENICAR HCT) 40-25 MG tablet, Take 1 tablet by mouth daily., Disp: 90 tablet, Rfl: 1 .  VENTOLIN HFA 108 (90 Base) MCG/ACT inhaler, INHALE 2 PUFFS INTO THE LUNGS EVERY 6 HOURS AS NEEDED FOR WHEEZING OR SHORTNESS OF BREATH. (Patient not taking: Reported on 04/10/2018), Disp: 18 g, Rfl: 1   Allergies  Allergen Reactions  . Codeine Other (See Comments)    headache  . Penicillins Other (See Comments)    headache  . Sinus & Allergy [Pseudoephedrine] Other (See Comments)    Sinus medications do not work for her, they dry her up so badly that they give her asthma attacks.     Past Medical History:  Diagnosis Date  . Asthma   . Colon polyp    hyperplastic in 2004 and 2013   . Hypertension age 62  . Seasonal allergies   . Tenosynovitis, de Quervain    Dr. Amanda PeaGramig, resolved     Past Surgical History:  Procedure Laterality Date  . COLONOSCOPY  08/20/12   Dr. Elnoria HowardHung (hyperplastic polyp)  . LACERATION REPAIR Right 10/16   cut finger  . TOTAL ABDOMINAL HYSTERECTOMY W/ BILATERAL SALPINGOOPHORECTOMY  05/2003   fibroids    Objective:   Vitals: BP (!) 153/80   Pulse 100   Temp 98.7 F (37.1 C)   Resp 16   SpO2 99%   Physical Exam  Constitutional: She is oriented to person, place, and time. She appears well-developed and well-nourished.  HENT:  TMs opaque bilaterally but without erythema.  Mucous membranes dry, nasal turbinates boggy and edematous.  Throat with postnasal drainage.  No exudates or tonsillar edema.  Eyes: Right eye exhibits no discharge. Left eye exhibits no discharge. No scleral icterus.  Cardiovascular: Normal rate, regular rhythm and intact distal pulses. Exam reveals no gallop and no friction rub.  No murmur heard. Pulmonary/Chest: No stridor. No respiratory distress. She has no wheezes. She has no rales.  Neurological: She is alert and oriented to person, place, and time.  Skin: Skin is warm and dry.  Psychiatric: She has a normal mood and affect.  Assessment and Plan :   Allergic rhinitis due to other allergic trigger, unspecified seasonality  Mild persistent asthma without complication  Cough  Atypical chest pain  Shortness of breath  Bronchospasm  We will manage patient for an allergic rhinitis that is likely causing bronchospasms and respiratory symptoms in general.  Will use a short steroid course given that patient just finished taking a course of doxycycline.  For cough suppression use Tessalon, refilled her albuterol.  Maintain Singulair and allergy medications except for Flonase.  Return to clinic precautions  discussed.   Wallis Bamberg, [place] 05/20/18 1922

## 2018-05-20 NOTE — Discharge Instructions (Addendum)
Hydrate well with at least 2 liters (1 gallon) of water daily. For sore throat try using a honey-based tea. Use 3 teaspoons of honey with juice squeezed from half lemon. Place shaved pieces of ginger into 1/2-1 cup of water and warm over stove top. Then mix the ingredients and repeat every 4 hours as needed.  °

## 2018-05-21 ENCOUNTER — Other Ambulatory Visit: Payer: 59

## 2018-05-21 MED FILL — predniSONE 10 MG TABS: 10 | 5 days supply | Qty: 20 | Fill #0

## 2018-05-21 MED FILL — VENTOLIN HFA 90 MCG INHALER: 108 (90 BAS | 25 days supply | Qty: 18 | Fill #0

## 2018-05-21 MED FILL — BENZONATATE 100 MG CAPS: 100 | 10 days supply | Qty: 60 | Fill #0

## 2018-05-30 ENCOUNTER — Ambulatory Visit (HOSPITAL_COMMUNITY)
Admission: EM | Admit: 2018-05-30 | Discharge: 2018-05-30 | Disposition: A | Discharge status: Home / Self Care | Payer: 59 | Attending: Family Medicine | Admitting: Family Medicine

## 2018-05-30 ENCOUNTER — Encounter (HOSPITAL_COMMUNITY): Payer: Self-pay | Admitting: Family Medicine

## 2018-05-30 ENCOUNTER — Ambulatory Visit (INDEPENDENT_AMBULATORY_CARE_PROVIDER_SITE_OTHER): Payer: 59

## 2018-05-30 DIAGNOSIS — R05 Cough: Secondary | ICD-10-CM

## 2018-05-30 DIAGNOSIS — R062 Wheezing: Secondary | ICD-10-CM | POA: Diagnosis not present

## 2018-05-30 DIAGNOSIS — J181 Lobar pneumonia, unspecified organism: Secondary | ICD-10-CM

## 2018-05-30 DIAGNOSIS — J189 Pneumonia, unspecified organism: Secondary | ICD-10-CM

## 2018-05-30 MED ORDER — IPRATROPIUM-ALBUTEROL 0.5-2.5 (3) MG/3ML IN SOLN
RESPIRATORY_TRACT | Status: AC
  Filled 2018-05-30: qty 3

## 2018-05-30 MED ORDER — AZITHROMYCIN 250 MG PO TABS: ORAL_TABLET | ORAL | 0 refills | Status: AC

## 2018-05-30 MED ORDER — IPRATROPIUM-ALBUTEROL 0.5-2.5 (3) MG/3ML IN SOLN
3.0000 mL | Freq: Once | RESPIRATORY_TRACT | Status: AC
  Administered 2018-05-30: 3 mL via RESPIRATORY_TRACT

## 2018-05-30 MED FILL — AZITHROMYCIN 250 MG TABLET: 250 | 5 days supply | Qty: 6 | Fill #0

## 2018-05-30 NOTE — ED Provider Notes (Signed)
[facility]    CSN: [id number] Arrival date & time: 05/30/18  1005     History   Chief Complaint Chief Complaint  Patient presents with  . Cough    HPI Natasha Bauer is a 62 y.o. female.   Natasha Bauer presents with complaints of persistent cough which started at least 10 days ago. Was seen here 6/10 and given 5 days of prednisone. States this has not helped cough. She feels her facial symptoms have improved, no longer feeling running nose, facial pressure or congestion. Still with post nasal drip. Ears pop. Last night had chills. Has been wheezing. Has been using her inhaler which helps some. Throat feels scratchy. Has occasionally been using tessalon which helps some. Denies gi/gu complaints. Hx of asthma, htn, allergies.    ROS per HPI.      Past Medical History:  Diagnosis Date  . Asthma   . Colon polyp    hyperplastic in 2004 and 2013   . Hypertension age 7  . Seasonal allergies   . Tenosynovitis, de Quervain    Dr. Amanda Pea, resolved    Patient Active Problem List   Diagnosis Date Noted  . Severe obesity (BMI >= 40) (HCC) 05/27/2015  . Right thyroid nodule 05/22/2014  . Obesity, Class II, BMI 35-39.9 04/30/2013  . Essential hypertension, benign 07/31/2012  . Asthma 07/31/2012  . Allergic rhinitis 07/31/2012    Past Surgical History:  Procedure Laterality Date  . COLONOSCOPY  08/20/12   Dr. Elnoria Howard (hyperplastic polyp)  . LACERATION REPAIR Right 10/16   cut finger  . TOTAL ABDOMINAL HYSTERECTOMY W/ BILATERAL SALPINGOOPHORECTOMY  05/2003   fibroids    OB History    Gravida  0   Para  0   Term  0   Preterm  0   AB  0   Living  0     SAB  0   TAB  0   Ectopic  0   Multiple  0   Live Births               Home Medications    Prior to Admission medications   Medication Sig Start Date End Date Taking? Authorizing Provider  albuterol (VENTOLIN HFA) 108 (90 Base) MCG/ACT inhaler INHALE 2 PUFFS INTO THE LUNGS EVERY 6 HOURS  AS NEEDED FOR WHEEZING OR SHORTNESS OF BREATH. 05/20/18   Wallis Bamberg, PA-C  amLODipine (NORVASC) 5 MG tablet Take 1 tablet (5 mg total) by mouth daily. 04/10/18   Joselyn Arrow, MD  aspirin 81 MG tablet Take 81 mg by mouth daily.    [provider]  azithromycin (ZITHROMAX) 250 MG tablet Take 2 tablets (500 mg total) by mouth daily for 1 day, THEN 1 tablet (250 mg total) daily for 4 days. 05/30/18 06/04/18  Georgetta Haber, NP  benzonatate (TESSALON) 100 MG capsule Take 1-2 capsules (100-200 mg total) by mouth 3 (three) times daily as needed. 05/20/18   Wallis Bamberg, PA-C  fluticasone (FLONASE) 50 MCG/ACT nasal spray PLACE 2 SPRAYS INTO BOTH NOSTRILS DAILY. 09/27/16   Joselyn Arrow, MD  ibuprofen (ADVIL,MOTRIN) 800 MG tablet Take 1 tablet (800 mg total) by mouth 3 (three) times daily. 04/05/18   Wieters, Hallie C, PA-C  montelukast (SINGULAIR) 10 MG tablet Take 1 tablet (10 mg total) by mouth at bedtime. 04/10/18   Joselyn Arrow, MD  Multiple Vitamins-Minerals (CENTRUM SILVER PO) Take 1 tablet by mouth daily.    [provider]  olmesartan-hydrochlorothiazide (BENICAR HCT)  40-25 MG tablet Take 1 tablet by mouth daily. 04/10/18   Joselyn Arrow, MD  predniSONE (DELTASONE) 10 MG tablet Take 4 tablets (40 mg total) by mouth daily with breakfast. 05/20/18   Wallis Bamberg, PA-C    Family History Family History  Problem Relation Age of Onset  . Hypertension Father   . Congestive Heart Failure Father   . Heart disease Father   . Cancer Mother 15       colon cancer  . Diabetes Mother   . Colon cancer Mother 39  . Arthritis Sister        rheumatoid arthritis  . Hypertension Sister   . Heart disease Sister        CHF  . Arthritis Brother        rheumatoid arthritis    Social History Social History   Tobacco Use  . Smoking status: Former Smoker    Packs/day: 0.00    Years: 8.00    Pack years: 0.00    Types: Cigarettes    Last attempt to quit: 12/12/1999    Years since quitting: 18.4  .  Smokeless tobacco: Never Used  . Tobacco comment: +passive tobacco exposure from her husband  Substance Use Topics  . Alcohol use: No    Alcohol/week: 0.0 oz    Comment: sip of wine at Christmas  . Drug use: No     Allergies   Codeine; Penicillins; and Sinus & allergy [pseudoephedrine]   Review of Systems Review of Systems   Physical Exam Triage Vital Signs ED Triage Vitals  Enc Vitals Group     BP 05/30/18 1028 (!) 144/81     Pulse Rate 05/30/18 1028 (!) 102     Resp 05/30/18 1028 20     Temp 05/30/18 1028 97.9 F (36.6 C)     Temp src --      SpO2 05/30/18 1028 98 %     Weight --      Height --      Head Circumference --      Peak Flow --      Pain Score 05/30/18 1027 0     Pain Loc --      Pain Edu? --      Excl. in GC? --    No data found.  Updated Vital Signs BP (!) 144/81   Pulse (!) 102   Temp 97.9 F (36.6 C)   Resp 20   SpO2 98%    Physical Exam  Constitutional: She is oriented to person, place, and time. She appears well-developed and well-nourished. No distress.  HENT:  Head: Normocephalic and atraumatic.  Right Ear: External ear and ear canal normal. A middle ear effusion is present.  Left Ear: External ear and ear canal normal. A middle ear effusion is present.  Nose: Nose normal. Right sinus exhibits no maxillary sinus tenderness and no frontal sinus tenderness. Left sinus exhibits no maxillary sinus tenderness and no frontal sinus tenderness.  Mouth/Throat: Uvula is midline, oropharynx is clear and moist and mucous membranes are normal. No tonsillar exudate.  Eyes: Pupils are equal, round, and reactive to light. Conjunctivae and EOM are normal.  Cardiovascular: Regular rhythm and normal heart sounds. Tachycardia present.  Pulmonary/Chest: Effort normal. No tachypnea. No respiratory distress. She has wheezes. She has rhonchi in the right lower field.  Expiratory wheezing throughout noted; strong congested cough  Neurological: She is alert and  oriented to person, place, and time.  Skin: Skin is warm and  dry.     UC Treatments / Results  Labs (all labs ordered are listed, but only abnormal results are displayed) Labs Reviewed - No data to display  EKG None  Radiology Dg Chest 2 View  Result Date: 05/30/2018 CLINICAL DATA:  Cough.  Wheezing. EXAM: CHEST - 2 VIEW COMPARISON:  01/19/2005. FINDINGS: Right mid lung infiltrate consistent with pneumonia. Close follow-up chest x-rays suggested demonstrate clearing. No pleural effusion or pneumothorax. Mild cardiomegaly. No acute bony abnormality. Thoracic spine scoliosis and degenerative change. IMPRESSION: 1. Prominent right mid lung field infiltrate most consistent pneumonia. Followup PA and lateral chest X-ray is recommended in 3-4 weeks following trial of antibiotic therapy to ensure resolution and exclude underlying malignancy. 2.  Mild cardiomegaly. Electronically Signed   By: Maisie Fushomas  Register   On: 05/30/2018 11:00    Procedures Procedures (including critical care time)  Medications Ordered in UC Medications  ipratropium-albuterol (DUONEB) 0.5-2.5 (3) MG/3ML nebulizer solution 3 mL (3 mLs Nebulization Given 05/30/18 1100)    Initial Impression / Assessment and Plan / UC Course  I have reviewed the triage vital signs and the nursing notes.  Pertinent labs & imaging results that were available during my care of the patient were reviewed by me and considered in my medical decision making (see chart for details).     Tachycardia, chills. Pneumonia on chest xray. Wheezing improved with duoneb. Non toxic in appearance without tachypnea, hypoxia or increased work of breathing. Course of antibiotics initiated, encouraged follow up with PCP for recheck in the next 1-2 weeks. Return precautions provided. Patient verbalized understanding and agreeable to plan.  Ambulatory out of clinic without difficulty.    Final Clinical Impressions(s) / UC Diagnoses   Final diagnoses:    Community acquired pneumonia of right middle lobe of lung (HCC)     Discharge Instructions     Pneumonia is present on chest xray.  We will start you on antibiotics. Continue to use your inhaler as needed as well as previously prescribed medications. Tylenol and/or ibuprofen as needed for pain or fevers.  Please follow up with your primary care provider in the next 1-2 weeks for recheck. If develop worsening of symptoms- weakness, chest pain, fevers, chills, shortness of breath, please return or go to Er.     ED Prescriptions    Medication Sig Dispense Auth. Provider   azithromycin (ZITHROMAX) 250 MG tablet Take 2 tablets (500 mg total) by mouth daily for 1 day, THEN 1 tablet (250 mg total) daily for 4 days. 6 tablet Georgetta HaberBurky, Moxie Kalil B, NP     Controlled Substance Prescriptions [place] Controlled Substance Registry consulted? Not Applicable   Georgetta HaberBurky, Axtyn Woehler B, NP 05/30/18 1116

## 2018-05-30 NOTE — ED Triage Notes (Signed)
Pt here for cough, fever, chills, and congestion since yesterday, worse last night. She was here on the 10th and given prednisone which she has finished. She has gotten worse.

## 2018-05-30 NOTE — Discharge Instructions (Signed)
Pneumonia is present on chest xray.  We will start you on antibiotics. Continue to use your inhaler as needed as well as previously prescribed medications. Tylenol and/or ibuprofen as needed for pain or fevers.  Please follow up with your primary care provider in the next 1-2 weeks for recheck. If develop worsening of symptoms- weakness, chest pain, fevers, chills, shortness of breath, please return or go to Er.

## 2018-05-31 ENCOUNTER — Other Ambulatory Visit: Payer: 59

## 2018-06-12 ENCOUNTER — Ambulatory Visit
Admission: RE | Admit: 2018-06-12 | Discharge: 2018-06-12 | Disposition: A | Discharge status: Home / Self Care | Payer: 59 | Source: Ambulatory Visit | Attending: Family Medicine | Admitting: Family Medicine

## 2018-06-12 DIAGNOSIS — E042 Nontoxic multinodular goiter: Secondary | ICD-10-CM | POA: Diagnosis not present

## 2018-06-12 DIAGNOSIS — R590 Localized enlarged lymph nodes: Secondary | ICD-10-CM

## 2018-06-12 DIAGNOSIS — E041 Nontoxic single thyroid nodule: Secondary | ICD-10-CM

## 2018-06-25 ENCOUNTER — Encounter: Payer: Self-pay | Admitting: Family Medicine

## 2018-06-25 DIAGNOSIS — Z1231 Encounter for screening mammogram for malignant neoplasm of breast: Secondary | ICD-10-CM | POA: Diagnosis not present

## 2018-06-25 LAB — HM MAMMOGRAPHY

## 2018-07-17 MED FILL — AMLODIPINE BESYLATE 5 MG TA: 5 | 90 days supply | Qty: 90 | Fill #1

## 2018-07-25 MED FILL — OLMESARTAN-HCTZ 40-25 MG TA: 40-25 | 90 days supply | Qty: 90 | Fill #1

## 2018-09-23 ENCOUNTER — Other Ambulatory Visit: Payer: 59

## 2018-09-23 DIAGNOSIS — E041 Nontoxic single thyroid nodule: Secondary | ICD-10-CM

## 2018-09-23 DIAGNOSIS — R590 Localized enlarged lymph nodes: Secondary | ICD-10-CM

## 2018-09-23 DIAGNOSIS — I10 Essential (primary) hypertension: Secondary | ICD-10-CM | POA: Diagnosis not present

## 2018-09-24 LAB — COMPREHENSIVE METABOLIC PANEL
ALT: 21 IU/L (ref 0–32)
AST: 20 IU/L (ref 0–40)
Albumin/Globulin Ratio: 1.8 (ref 1.2–2.2)
Albumin: 4.6 g/dL (ref 3.6–4.8)
Alkaline Phosphatase: 61 IU/L (ref 39–117)
BUN/Creatinine Ratio: 19 (ref 12–28)
BUN: 16 mg/dL (ref 8–27)
Bilirubin Total: <0.2 mg/dL (ref 0.0–1.2)
CO2: 26 mmol/L (ref 20–29)
Calcium: 9.5 mg/dL (ref 8.7–10.3)
Chloride: 102 mmol/L (ref 96–106)
Creatinine, Ser: 0.83 mg/dL (ref 0.57–1.00)
GFR calc Af Amer: 89 mL/min/{1.73_m2} (ref >59)
GFR calc non Af Amer: 77 mL/min/{1.73_m2} (ref >59)
Globulin, Total: 2.5 g/dL (ref 1.5–4.5)
Glucose: 98 mg/dL (ref 65–99)
Potassium: 4.6 mmol/L (ref 3.5–5.2)
Sodium: 144 mmol/L (ref 134–144)
Total Protein: 7.1 g/dL (ref 6.0–8.5)

## 2018-09-24 LAB — LIPID PANEL
Chol/HDL Ratio: 2.1 ratio (ref 0.0–4.4)
Cholesterol, Total: 208 mg/dL — ABNORMAL HIGH (ref 100–199)
HDL: 98 mg/dL (ref >39)
LDL Calculated: 96 mg/dL (ref 0–99)
Triglycerides: 70 mg/dL (ref 0–149)
VLDL Cholesterol Cal: 14 mg/dL (ref 5–40)

## 2018-09-24 LAB — CBC WITH DIFFERENTIAL/PLATELET
BASOS ABS: 0 10*3/uL (ref 0.0–0.2)
Basos: 1 %
EOS (ABSOLUTE): 0.2 10*3/uL (ref 0.0–0.4)
Eos: 3 %
Hematocrit: 41.3 % (ref 34.0–46.6)
Hemoglobin: 13 g/dL (ref 11.1–15.9)
Immature Grans (Abs): 0 10*3/uL (ref 0.0–0.1)
Immature Granulocytes: 0 %
LYMPHS: 43 %
Lymphocytes Absolute: 2.6 10*3/uL (ref 0.7–3.1)
MCH: 24.9 pg — ABNORMAL LOW (ref 26.6–33.0)
MCHC: 31.5 g/dL (ref 31.5–35.7)
MCV: 79 fL (ref 79–97)
MONOS ABS: 0.5 10*3/uL (ref 0.1–0.9)
Monocytes: 9 %
Neutrophils Absolute: 2.7 10*3/uL (ref 1.4–7.0)
Neutrophils: 44 %
PLATELETS: 211 10*3/uL (ref 150–450)
RBC: 5.23 x10E6/uL (ref 3.77–5.28)
RDW: 15.8 % — AB (ref 12.3–15.4)
WBC: 6 10*3/uL (ref 3.4–10.8)

## 2018-09-24 LAB — TSH: TSH: 0.712 u[IU]/mL (ref 0.450–4.500)

## 2018-09-29 DIAGNOSIS — Z800 Family history of malignant neoplasm of digestive organs: Secondary | ICD-10-CM | POA: Insufficient documentation

## 2018-09-29 NOTE — Patient Instructions (Addendum)
  HEALTH MAINTENANCE RECOMMENDATIONS:  It is recommended that you get at least 30 minutes of aerobic exercise at least 5 days/week (for weight loss, you may need as much as 60-90 minutes). This can be any activity that gets your heart rate up. This can be divided in 10-15 minute intervals if needed, but try and build up your endurance at least once a week.  Weight bearing exercise is also recommended twice weekly.  Eat a healthy diet with lots of vegetables, fruits and fiber.  "Colorful" foods have a lot of vitamins (ie green vegetables, tomatoes, red peppers, etc).  Limit sweet tea, regular sodas and alcoholic beverages, all of which has a lot of calories and sugar.  Up to 1 alcoholic drink daily may be beneficial for women (unless trying to lose weight, watch sugars).  Drink a lot of water.  Calcium recommendations are 1200-1500 mg daily (1500 mg for postmenopausal women or women without ovaries), and vitamin D 1000 IU daily.  This should be obtained from diet and/or supplements (vitamins), and calcium should not be taken all at once, but in divided doses.  Monthly self breast exams and yearly mammograms for women over the age of 32 is recommended.  Sunscreen of at least SPF 30 should be used on all sun-exposed parts of the skin when outside between the hours of 10 am and 4 pm (not just when at beach or pool, but even with exercise, golf, tennis, and yard work!)  Use a sunscreen that says "broad spectrum" so it covers both UVA and UVB rays, and make sure to reapply every 1-2 hours.  Remember to change the batteries in your smoke detectors when changing your clock times in the spring and fall.  Use your seat belt every time you are in a car, and please drive safely and not be distracted with cell phones and texting while driving.  I recommend getting the new shingles vaccine (Shingrix). You will need to check with your insurance to see if it is covered, and if covered, schedule a nurse visit to get  at our office.  It is a series of 2 injections, spaced 2 months apart.   Increase your amlodipine to 1.5 tablets daily, and continue your olmesartan-HCT.  Continue to monitor your blood pressure and try and limit the sodium in your diet (low sodium soups, don't order soup out). If/when you start seeing BP consistently under 120, then you may cut back to just 5mg  daily. I'm giving you this option since last month your blood pressures seemed to be lower than they are now. Currently they aren't at goal. Blood pressure goal is ideally <130/80, but up to 135/85 at times is okay. If you have any questions or concerns about your blood pressure or dose, please drop off or mail your blood pressure list.  Limit your portions of food, especially cutting back on the carbs in your diet to help with weight loss.  Try switching to diet soda (find ones perhaps sweetened with stevia or other non-aspartame sweetener).  Drink more water.  Take the Singulair every day--it is a preventative medication that works best that way. (the option we discussed was potentially to stop it only when it is NOT allergy season, but probably best to just take daily).  Double check with Dr. Haywood Pao office when you are next due for your colonoscopy (due to your mother's colon cancer).  Go to [facility] for your chest x-ray as follow-up to your pneumonia.

## 2018-09-29 NOTE — Progress Notes (Signed)
Chief Complaint  Patient presents with  . Annual Exam    nonfasting annual exam with pelvic. Had eye exam already this year. No concerns.     Natasha Bauer is a 62 y.o. female who presents for a complete physical.  She has the following concerns:  She had RML community acquired pneumonia diagnosed by UC in June.  She did not follow-up with PCP as recommended (hasn't had f/u CXR). She was treated with nebulizer at visit, and prescribed Zpak.  Symptoms completely resolved (reports she called here, spoke with someone, couldn't get appt??). Hasn't needed to use albuterol since then.  Hypertension: She continues onAmlodipine 5mg and olmesartan-HCT. Her BP's had been running 137/89 at home this  Morning. Has been 120-143/76-92.  Early in Sept/Oct there were more BP's in the 120's, but in the last couple of weeks they have been averaging over 135/85. Admits to some more soup and higher sodium diet recently.  She denies any side effects. She denies headaches, dizziness, chest pain, palpitations, muscle cramps. Eating more fruits, vegetables, cutting back on soft drinks (still has 1 ginger ale or [employer] daily). Cut out cookies and cakes, getting sugar from the soda instead.  Some soups recently, but in general continues to try and limit salt (limits/avoids lunchmeat, sausage, french fries, chips, bacon.)  Asthma/allergies: She is using the Singulair, admits to only using it 4 days/week. She uses albuterol prn, about once every few months, during allergy season. Hasn't needed any since her pneumonia in June. She feels like her asthma and allergies are controlled.Last spirometry was normal in 08/2017.  Obesity:She is stillair-frying her food(chicken, pork chop). Drinks one 16 oz bottle ofginger ale or [employer] daily, drinking more water (gets a headache from aspartame). She is tolerating Splenda in her coffee.  No longer skipping meals, continues eating small meals more frequently.    H/o anterior cervical mass (left sided)--she reports the swelling in neck "goes up and down and I'm not worried about it".She is going to the dentist regularly. No adenopathy was noted on recent US, no mention of this mass.  Thyroid nodules--s/p ultrasound and biopsy in 2015. Denies changes, symptoms. This was last evaluated with ultrasound in July 2019: IMPRESSION: Left inferior thyroid nodule does not meet criteria for biopsy or surveillance, as designated by the newly established ACR TI-RADS criteria.  The right thyroid nodule has been previously biopsied. If the prior biopsy was benign, updated ACR criteria recommend no specific further follow-up.  Immunization History  Administered Date(s) Administered  . Influenza Split 08/21/2012, 09/10/2013  . Influenza,inj,Quad PF,6+ Mos 08/28/2017  . Influenza-Unspecified 09/10/2016  . Pneumococcal Polysaccharide-23 12/28/2010  . Tdap 04/09/2012   Last Pap smear: N/a (s/p hysterectomy for benign reasons)  Last mammogram: 06/2018 Last colonoscopy: 08/2012, hyperplastic polyp (Dr. Elnoria Howard).(Was asked to check with GI when due again, due to mother's hx--she was told it was 10 years, but was also told to schedule appt and she admits she didn't.) Last DEXA: never  Dentist: regularly Ophtho: Yearly Exercise: rides stationery bike 3-4x/week, 30-45 minutes. Has some hand weights 2x/week. Normal vitamin D screen in the past (2014)  Past Medical History:  Diagnosis Date  . Asthma   . Colon polyp    hyperplastic in 2004 and 2013   . Hypertension age 62  . Seasonal allergies   . Tenosynovitis, de Quervain    Dr. Amanda Pea, resolved    Past Surgical History:  Procedure Laterality Date  . COLONOSCOPY  08/20/12   Dr.  Hung (hyperplastic polyp)  . LACERATION REPAIR Right 10/16   cut finger  . TOTAL ABDOMINAL HYSTERECTOMY W/ BILATERAL SALPINGOOPHORECTOMY  05/2003   fibroids    Social History   Socioeconomic History  . Marital status:  Married    Spouse name: Not on file  . Number of children: 0  . Years of education: Not on file  . Highest education level: Not on file  Occupational History  . Occupation: Probation officer, and private caregiver    Employer: [employer]  . Occupation: [employer] (Probation officer)    Employer: [employer]  Social Needs  . Financial resource strain: Not on file  . Food insecurity:    Worry: Not on file    Inability: Not on file  . Transportation needs:    Medical: Not on file    Non-medical: Not on file  Tobacco Use  . Smoking status: Former Smoker    Packs/day: 0.00    Years: 8.00    Pack years: 0.00    Types: Cigarettes    Last attempt to quit: 12/12/1999    Years since quitting: 18.8  . Smokeless tobacco: Never Used  . Tobacco comment: +passive tobacco exposure from her husband  Substance and Sexual Activity  . Alcohol use: No    Alcohol/week: 0.0 standard drinks    Comment: sip of wine at Christmas  . Drug use: No  . Sexual activity: Yes    Partners: Male    Birth control/protection: Surgical  Lifestyle  . Physical activity:    Days per week: Not on file    Minutes per session: Not on file  . Stress: Not on file  Relationships  . Social connections:    Talks on phone: Not on file    Gets together: Not on file    Attends religious service: Not on file    Active member of club or organization: Not on file    Attends meetings of clubs or organizations: Not on file    Relationship status: Not on file  . Intimate partner violence:    Fear of current or ex partner: Not on file    Emotionally abused: Not on file    Physically abused: Not on file    Forced sexual activity: Not on file  Other Topics Concern  . Not on file  Social History Narrative   Lives with her husband. No children.  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 Lots of walking on the job at [employer],  pushing carts, lifting things.    Family History  Problem Relation Age of Onset  . Hypertension Father   . Congestive Heart Failure Father   . Heart disease Father   . Cancer Mother 35       colon cancer  . Diabetes Mother   . Colon cancer Mother 35  . Arthritis Sister        rheumatoid arthritis  . Hypertension Sister   . Heart disease Sister        CHF  . Arthritis Brother        rheumatoid arthritis    Outpatient Encounter Medications as of 09/30/2018  Medication Sig Note  . amLODipine (NORVASC) 5 MG tablet Take 1 tablet (5 mg total) by mouth daily.   Marland Kitchen aspirin 81 MG tablet Take 81 mg by mouth daily.   . montelukast (SINGULAIR) 10 MG tablet Take  1 tablet (10 mg total) by mouth at bedtime. 09/30/2018: Taking about 4x/week  . Multiple Vitamins-Minerals (CENTRUM SILVER PO) Take 1 tablet by mouth daily.   Marland Kitchen olmesartan-hydrochlorothiazide (BENICAR HCT) 40-25 MG tablet Take 1 tablet by mouth daily.   Marland Kitchen albuterol (VENTOLIN HFA) 108 (90 Base) MCG/ACT inhaler INHALE 2 PUFFS INTO THE LUNGS EVERY 6 HOURS AS NEEDED FOR WHEEZING OR SHORTNESS OF BREATH. (Patient not taking: Reported on 09/30/2018)   . fluticasone (FLONASE) 50 MCG/ACT nasal spray PLACE 2 SPRAYS INTO BOTH NOSTRILS DAILY. (Patient not taking: Reported on 09/30/2018) 01/29/2017: Uses in the Spring  . ibuprofen (ADVIL,MOTRIN) 800 MG tablet Take 1 tablet (800 mg total) by mouth 3 (three) times daily. (Patient not taking: Reported on 09/30/2018)   . [DISCONTINUED] benzonatate (TESSALON) 100 MG capsule Take 1-2 capsules (100-200 mg total) by mouth 3 (three) times daily as needed.   . [DISCONTINUED] predniSONE (DELTASONE) 10 MG tablet Take 4 tablets (40 mg total) by mouth daily with breakfast.    No facility-administered encounter medications on file as of 09/30/2018.     Allergies  Allergen Reactions  . Codeine Other (See Comments)    headache  . Penicillins Other (See Comments)    headache  . Sinus & Allergy [Pseudoephedrine]  Other (See Comments)    Sinus medications do not work for her, they dry her up so badly that they give her asthma attacks.    ROS: The patient denies anorexia, fever, headaches, vision changes, decreased hearing, ear pain, sore throat, breast concerns, chest pain, palpitations, dizziness, syncope, dyspnea on exertion, cough, swelling, nausea, vomiting, diarrhea, constipation, abdominal pain, melena, hematochezia, indigestion/heartburn, hematuria, incontinence, dysuria, vaginal bleeding, discharge, odor or itch, genital lesions, joint pains, numbness, weakness, tremor, suspicious skin lesions, depression, anxiety, abnormal bleeding/bruising, or enlarged lymph nodes.  Occasional sweats, tolerable, mostly just facial. Mild occasional vaginal dryness. Occasional tingling in the right index finger (that one that she had cut/injured).    PHYSICAL EXAM:  BP (!) 144/90   Pulse 80   Ht 5' 1.5" (1.562 m)   Wt 208 lb 9.6 oz (94.6 kg)   BMI 38.78 kg/m    154/94 on repeat by MD  Wt Readings from Last 3 Encounters:  09/30/18 208 lb 9.6 oz (94.6 kg)  04/10/18 208 lb (94.3 kg)  10/10/17 211 lb 9.6 oz (96 kg)   General Appearance:   Alert, cooperative, no distress, appears stated age  Head:   Normocephalic, without obvious abnormality, atraumatic  Eyes:   PERRL, conjunctiva/corneas clear, EOM's intact, fundi benign  Ears:   Normal TM's and external ear canals  Nose:  Nares normal, mucosa is mildly edematous, no erythema or drainage; no sinus tenderness  Throat:  Lips, mucosa, and tongue normal; teeth and gums normal  Neck:  Supple, thyroid: mildly enlarged, R>L, no dominant mass. no carotid bruit or JVD. 1.5-2 cm nontender submandibular mass is present on the left.This is mobile, nontender. Unchanged in size.  Back:  Spine nontender, no curvature, ROM normal, no CVAtenderness  Lungs:  Clear to auscultation bilaterally without wheezes, rales or ronchi;  respirations unlabored  Chest Wall:  No tenderness or deformity  Heart:  Regular rate and rhythm, S1 and S2 normal, no murmur, rub or gallop  Breast Exam:  No tenderness, masses, or nipple discharge or inversion. No axillary lymphadenopathy  Abdomen:  Soft, non-tender, nondistended, normoactive bowel sounds,  no masses, no hepatosplenomegaly. Obese. WHSS inferiorly  Genitalia:  Normal external genitalia without lesions. BUS and vagina normal;  uterus is surgically absent. No adnexal masses or tenderness appreciable but exam is limited due to body habitus. No masses are appreciable, and no tenderness. Pap not performed  Rectal:  Normal tone, no masses or tenderness; guaiac negative stool  Extremities: No clubbing, cyanosis or edema.   Pulses: 2+ and symmetric all extremities  Skin: Skin color, texture, turgor normal, no rashes or lesions.  Lymph nodes: Supraclavicular, and axillary nodes normal. See neck exam above.  Neurologic: CNII-XII intact, normal strength, sensation and gait; reflexes 2+ and symmetric throughout   Psych:Normal mood, affect, hygiene and grooming  Normal urinalysis  Lab Results  Component Value Date   WBC 6.0 09/23/2018   HGB 13.0 09/23/2018   HCT 41.3 09/23/2018   MCV 79 09/23/2018   PLT 211 09/23/2018   Lab Results  Component Value Date   TSH 0.712 09/23/2018     Chemistry      Component Value Date/Time   NA 144 09/23/2018 0924   K 4.6 09/23/2018 0924   CL 102 09/23/2018 0924   CO2 26 09/23/2018 0924   BUN 16 09/23/2018 0924   CREATININE 0.83 09/23/2018 0924   CREATININE 0.77 08/28/2017 0955      Component Value Date/Time   CALCIUM 9.5 09/23/2018 0924   ALKPHOS 61 09/23/2018 0924   AST 20 09/23/2018 0924   ALT 21 09/23/2018 0924   BILITOT <0.2 09/23/2018 0924     Fasting glucose 98  Lab Results  Component Value Date   CHOL 208 (H) 09/23/2018   HDL 98 09/23/2018    LDLCALC 96 09/23/2018   TRIG 70 09/23/2018   CHOLHDL 2.1 09/23/2018     ASSESSMENT/PLAN:  Annual physical exam - Plan: POCT Urinalysis DIP (Proadvantage Device)  Essential hypertension, benign - BP's above goal; increase amlodipine to 7.5mg ; cut back on Na in diet. Cont olmesartan HCT. Wt loss and daily exercises recommended - Plan: amLODipine (NORVASC) 5 MG tablet, olmesartan-hydrochlorothiazide (BENICAR HCT) 40-25 MG tablet  Uncomplicated asthma, unspecified asthma severity, unspecified whether persistent - Plan: DG Chest 2 View, montelukast (SINGULAIR) 10 MG tablet, Spirometry with Graph  Multiple thyroid nodules - benign per [place] 06/2018  Family history of colon cancer in mother - to verify with GI when colonoscopy is due  Need for influenza vaccination - Plan: Flu Vaccine QUAD 6+ mos PF IM (Fluarix Quad PF)  Need for pneumococcal vaccination - Plan: Pneumococcal conjugate vaccine 13-valent  History of recent pneumonia - clinically resolved;  get f/u CXR - Plan: DG Chest 2 View  Severe obesity (BMI 35.0-39.9) with comorbidity (HCC) - counseled re: portions, healthy choices, exericse, risks. Weight loss encouraged   Flu shot prevnar-13 (due to asthma, instead of giving pneumovax booster; will give pneumovax at age 55) Spirometry F/u CXR from pna in June RML  Discussed monthly self breast exams and yearly mammograms; at least 30 minutes of aerobic activity at least 5 days/week, weight bearing exercise 2x/wk; proper sunscreen use reviewed; healthy diet, including goals of calcium and vitamin D intake and alcohol recommendations (less than or equal to 1 drink/day) reviewed; regular seatbelt use; changing batteries in smoke detectors. Immunization recommendations discussed--flu shot given; shingrix recommended, to check with insurance. Risks/side effects reviewed. Prevnar-13 given. Colonoscopy recommendations reviewed--UTD. Given mother's h/o colon cancer in her 84's, I recommended she  double check with GI to see when she is due (q5 yrs due to first degree relative with CA vs 10 year due to her hyperplastic polyp).    Increase your amlodipine  to 1.5 tablets daily, and continue your olmesartan-HCT.  Continue to monitor your blood pressure and try and limit the sodium in your diet (low sodium soups, don't order soup out). If/when you start seeing BP consistently under 120, then you may cut back to just 5mg  daily. I'm giving you this option since last month your blood pressures seemed to be lower than they are now. Currently they aren't at goal. Blood pressure goal is ideally <130/80, but up to 135/85 at times is okay. If you have any questions or concerns about your blood pressure or dose, please drop off or mail your blood pressure list.  Limit your portions of food, especially cutting back on the carbs in your diet to help with weight loss.  Try switching to diet soda (find ones perhaps sweetened with stevia or other non-aspartame sweetener).  Drink more water.  Take the Singulair every day--it is a preventative medication that works best that way. (the option we discussed was potentially to stop it only when it is NOT allergy season, but probably best to just take daily).  Double check with Dr. Haywood Pao office when you are next due for your colonoscopy (due to your mother's colon cancer).  Go to [facility] for your chest x-ray as follow-up to your pneumonia.

## 2018-09-30 ENCOUNTER — Ambulatory Visit (INDEPENDENT_AMBULATORY_CARE_PROVIDER_SITE_OTHER): Payer: 59 | Admitting: Family Medicine

## 2018-09-30 ENCOUNTER — Encounter: Payer: Self-pay | Admitting: Family Medicine

## 2018-09-30 VITALS — BP 144/90 | HR 80 | Ht 61.5 in | Wt 208.6 lb

## 2018-09-30 DIAGNOSIS — E042 Nontoxic multinodular goiter: Secondary | ICD-10-CM

## 2018-09-30 DIAGNOSIS — Z23 Encounter for immunization: Secondary | ICD-10-CM

## 2018-09-30 DIAGNOSIS — J45909 Unspecified asthma, uncomplicated: Secondary | ICD-10-CM

## 2018-09-30 DIAGNOSIS — Z800 Family history of malignant neoplasm of digestive organs: Secondary | ICD-10-CM

## 2018-09-30 DIAGNOSIS — Z0000 Encounter for general adult medical examination without abnormal findings: Secondary | ICD-10-CM | POA: Diagnosis not present

## 2018-09-30 DIAGNOSIS — Z8701 Personal history of pneumonia (recurrent): Secondary | ICD-10-CM

## 2018-09-30 DIAGNOSIS — I10 Essential (primary) hypertension: Secondary | ICD-10-CM

## 2018-09-30 LAB — POCT URINALYSIS DIP (PROADVANTAGE DEVICE)
Bilirubin, UA: NEGATIVE
Blood, UA: NEGATIVE
GLUCOSE UA: NEGATIVE mg/dL
Ketones, POC UA: NEGATIVE mg/dL
Leukocytes, UA: NEGATIVE
Nitrite, UA: NEGATIVE
Protein Ur, POC: NEGATIVE mg/dL
SPECIFIC GRAVITY, URINE: 1.015
UUROB: NEGATIVE
pH, UA: 6 (ref 5.0–8.0)

## 2018-09-30 MED ORDER — OLMESARTAN MEDOXOMIL-HCTZ 40-25 MG PO TABS: 1.0000 | ORAL_TABLET | Freq: Every day | ORAL | 1 refills | Status: DC

## 2018-09-30 MED ORDER — AMLODIPINE BESYLATE 5 MG PO TABS: 7.5000 mg | ORAL_TABLET | Freq: Every day | ORAL | 1 refills | Status: DC

## 2018-09-30 MED ORDER — MONTELUKAST SODIUM 10 MG PO TABS: 10.0000 mg | ORAL_TABLET | Freq: Every day | ORAL | 1 refills | Status: DC

## 2018-09-30 MED FILL — MONTELUKAST SOD 10 MG TAB: 10 | 90 days supply | Qty: 90 | Fill #0

## 2018-09-30 MED FILL — AMLODIPINE BESYLATE 5 MG TA: 5 | 90 days supply | Qty: 135 | Fill #0

## 2018-10-07 MED FILL — OLMESARTAN-HCTZ 40-25 MG TA: 40-25 | 90 days supply | Qty: 90 | Fill #0

## 2018-12-25 MED FILL — MONTELUKAST SOD 10 MG TAB: 10 | 90 days supply | Qty: 90 | Fill #1

## 2019-01-23 MED FILL — OLMESARTAN-HCTZ 40-25 MG TA: 40-25 | 90 days supply | Qty: 90 | Fill #1 | Status: TO

## 2019-01-31 MED FILL — AMLODIPINE BESYLATE 5 MG TA: 5 | 90 days supply | Qty: 135 | Fill #1 | Status: TO

## 2019-04-11 ENCOUNTER — Other Ambulatory Visit: Payer: Self-pay | Admitting: Family Medicine

## 2019-04-11 DIAGNOSIS — I10 Essential (primary) hypertension: Secondary | ICD-10-CM

## 2019-04-11 DIAGNOSIS — J309 Allergic rhinitis, unspecified: Secondary | ICD-10-CM

## 2019-04-11 DIAGNOSIS — J45909 Unspecified asthma, uncomplicated: Secondary | ICD-10-CM

## 2019-04-11 MED FILL — ALBUTEROL SULFATE HFA 108 (: 108 (90 BAS | 25 days supply | Qty: 18 | Fill #0

## 2019-04-11 MED FILL — FLUTICASONE PROP 50 MCG SPR: 50 | 30 days supply | Qty: 16 | Fill #0

## 2019-04-11 MED FILL — OLMESARTAN-HCTZ 40-25 MG TA: 40-25 | 30 days supply | Qty: 30 | Fill #0

## 2019-04-11 MED FILL — MONTELUKAST SOD 10 MG TAB: 10 | 90 days supply | Qty: 90 | Fill #0

## 2019-04-11 MED FILL — AMLODIPINE BESYLATE 5 MG TA: 5 | 90 days supply | Qty: 135 | Fill #0

## 2019-05-21 MED FILL — OLMESARTAN-HCTZ 40-25 MG TA: 40-25 | 30 days supply | Qty: 30 | Fill #1

## 2019-06-19 MED FILL — OLMESARTAN-HCTZ 40-25 MG TA: 40-25 | 30 days supply | Qty: 30 | Fill #2

## 2019-06-19 MED FILL — FLUTICASONE PROP 50 MCG SPR: 50 | 30 days supply | Qty: 16 | Fill #1

## 2019-07-01 DIAGNOSIS — Z1231 Encounter for screening mammogram for malignant neoplasm of breast: Secondary | ICD-10-CM | POA: Diagnosis not present

## 2019-07-01 LAB — HM MAMMOGRAPHY

## 2019-07-02 ENCOUNTER — Encounter: Payer: Self-pay | Admitting: *Deleted

## 2019-07-18 MED FILL — OLMESARTAN-HCTZ 40-25 MG TA: 40-25 | 30 days supply | Qty: 30 | Fill #3

## 2019-08-19 MED FILL — MONTELUKAST SOD 10 MG TAB: 10 | 90 days supply | Qty: 90 | Fill #1

## 2019-08-19 MED FILL — OLMESARTAN-HCTZ 40-25 MG TA: 40-25 | 30 days supply | Qty: 30 | Fill #4

## 2019-08-19 MED FILL — AMLODIPINE BESYLATE 5 MG TA: 5 | 90 days supply | Qty: 135 | Fill #1

## 2019-09-01 DIAGNOSIS — H524 Presbyopia: Secondary | ICD-10-CM | POA: Diagnosis not present

## 2019-09-01 DIAGNOSIS — H5203 Hypermetropia, bilateral: Secondary | ICD-10-CM | POA: Diagnosis not present

## 2019-09-11 IMAGING — DX DG CHEST 2V
2 series · 2 of 2 positions shown · non-contrast
Comparison: 01/19/2005.

CLINICAL DATA: Cough.  Wheezing.

EXAM:
CHEST - 2 VIEW

[chest pa]
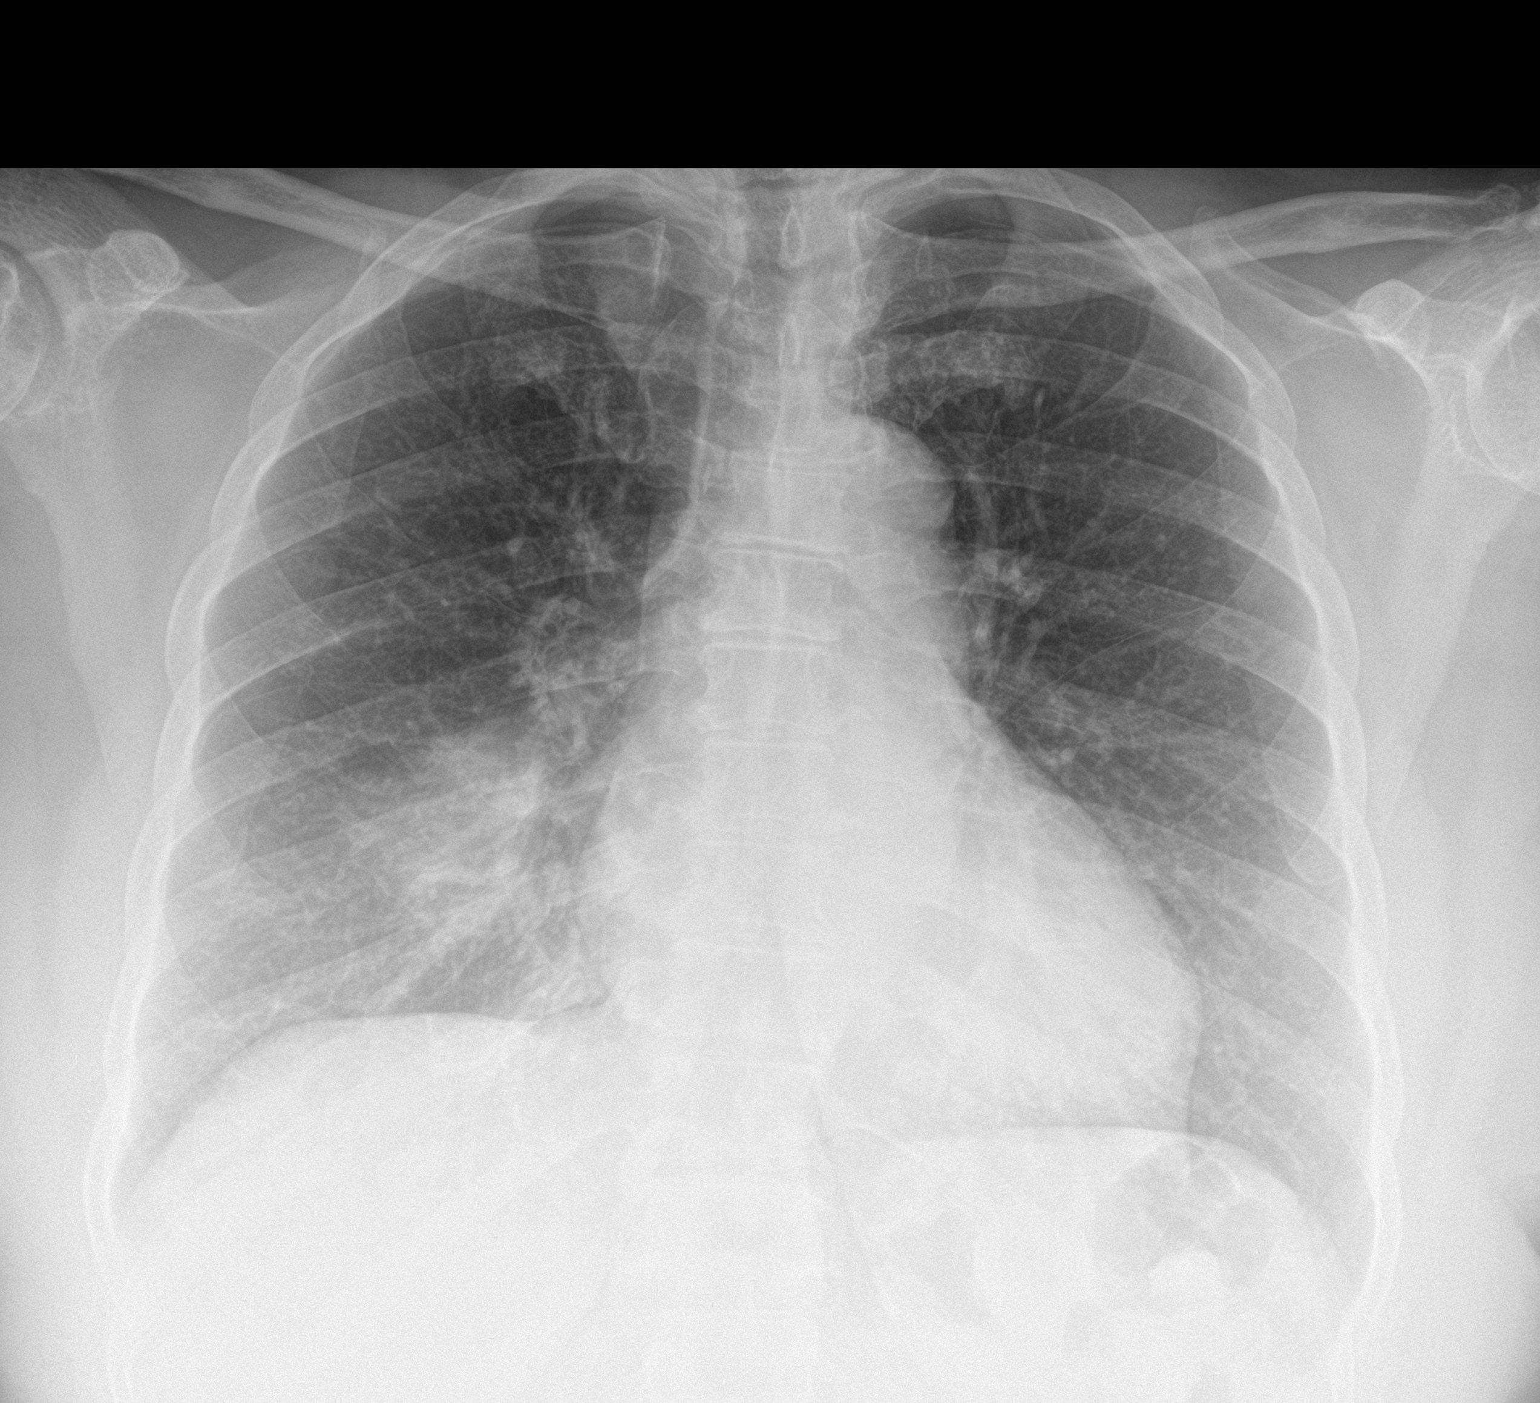

[chest lat]
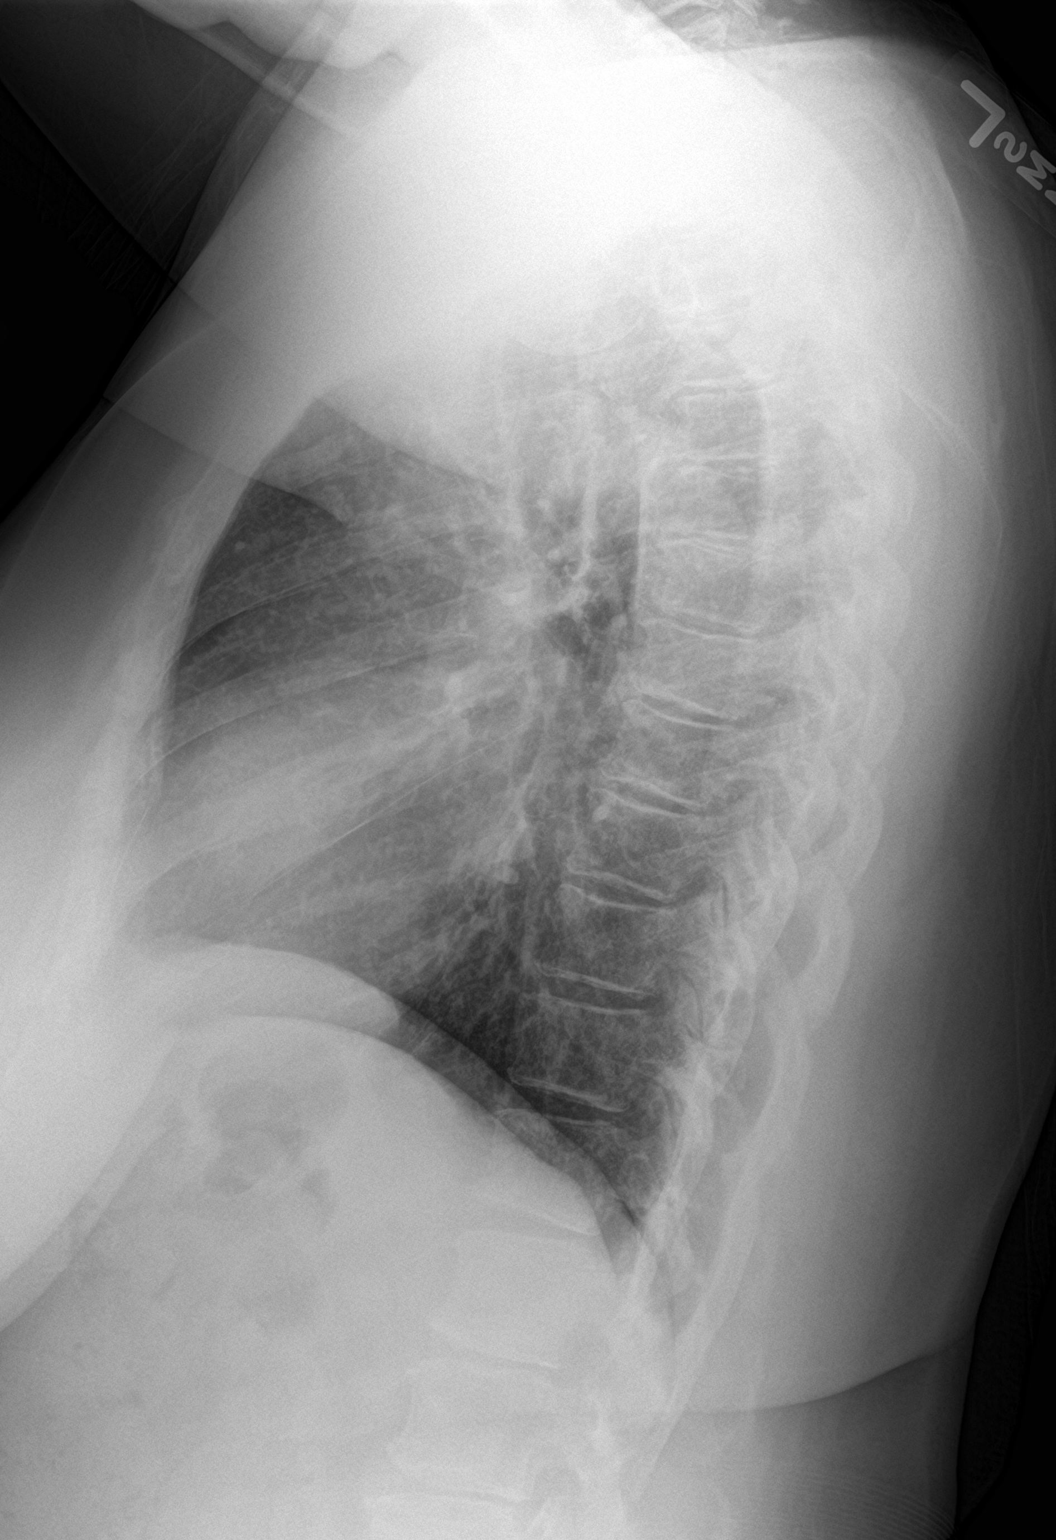

[2 of 2 positions shown; findings below may reference images not displayed]

FINDINGS: Right mid lung infiltrate consistent with pneumonia. Close follow-up
chest x-rays suggested demonstrate clearing. No pleural effusion or
pneumothorax. Mild cardiomegaly. No acute bony abnormality. Thoracic
spine scoliosis and degenerative change.
IMPRESSION: 1. Prominent right mid lung field infiltrate most consistent
pneumonia. Followup PA and lateral chest X-ray is recommended in 3-4
weeks following trial of antibiotic therapy to ensure resolution and
exclude underlying malignancy.

2.  Mild cardiomegaly.

## 2019-09-17 MED FILL — OLMESARTAN-HCTZ 40-25 MG TA: 40-25 | 30 days supply | Qty: 30 | Fill #5

## 2019-09-30 NOTE — Patient Instructions (Addendum)
  HEALTH MAINTENANCE RECOMMENDATIONS:  It is recommended that you get at least 30 minutes of aerobic exercise at least 5 days/week (for weight loss, you may need as much as 60-90 minutes). This can be any activity that gets your heart rate up. This can be divided in 10-15 minute intervals if needed, but try and build up your endurance at least once a week.  Weight bearing exercise is also recommended twice weekly.  Eat a healthy diet with lots of vegetables, fruits and fiber.  "Colorful" foods have a lot of vitamins (ie green vegetables, tomatoes, red peppers, etc).  Limit sweet tea, regular sodas and alcoholic beverages, all of which has a lot of calories and sugar.  Up to 1 alcoholic drink daily may be beneficial for women (unless trying to lose weight, watch sugars).  Drink a lot of water.  Calcium recommendations are 1200-1500 mg daily (1500 mg for postmenopausal women or women without ovaries), and vitamin D 1000 IU daily.  This should be obtained from diet and/or supplements (vitamins), and calcium should not be taken all at once, but in divided doses.  Monthly self breast exams and yearly mammograms for women over the age of 70 is recommended.  Sunscreen of at least SPF 30 should be used on all sun-exposed parts of the skin when outside between the hours of 10 am and 4 pm (not just when at beach or pool, but even with exercise, golf, tennis, and yard work!)  Use a sunscreen that says "broad spectrum" so it covers both UVA and UVB rays, and make sure to reapply every 1-2 hours.  Remember to change the batteries in your smoke detectors when changing your clock times in the spring and fall. Carbon monoxide detectors are recommended for your home.  Use your seat belt every time you are in a car, and please drive safely and not be distracted with cell phones and texting while driving.  I recommend getting the new shingles vaccine (Shingrix). You will need to check with your insurance to see if it  is covered, and if covered, schedule nurse visit at our office when convenient.  It is a series of 2 injections, spaced 2 months apart.  I strongly encourage you to contact your EAP to set up counseling for some grief and depression.  You can also look into grief counseling through Hosp[facility]. If your moods aren't improving, please return to consider medications.  Okay to take an additional 1000 IU of vitamin D3 (separate from your multivitamin) through the winter months (until March/April).  Take your montelukast daily, in order for it to work properly.
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
## 2019-10-02 ENCOUNTER — Ambulatory Visit (INDEPENDENT_AMBULATORY_CARE_PROVIDER_SITE_OTHER): Payer: 59 | Admitting: Family Medicine

## 2019-10-02 ENCOUNTER — Other Ambulatory Visit: Payer: Self-pay

## 2019-10-02 ENCOUNTER — Encounter: Payer: Self-pay | Admitting: Family Medicine

## 2019-10-02 VITALS — BP 136/80 | HR 80 | Temp 98.6°F | Ht 61.0 in | Wt 211.2 lb

## 2019-10-02 DIAGNOSIS — I10 Essential (primary) hypertension: Secondary | ICD-10-CM

## 2019-10-02 DIAGNOSIS — R221 Localized swelling, mass and lump, neck: Secondary | ICD-10-CM | POA: Diagnosis not present

## 2019-10-02 DIAGNOSIS — J45909 Unspecified asthma, uncomplicated: Secondary | ICD-10-CM | POA: Diagnosis not present

## 2019-10-02 DIAGNOSIS — Z0000 Encounter for general adult medical examination without abnormal findings: Secondary | ICD-10-CM | POA: Diagnosis not present

## 2019-10-02 DIAGNOSIS — E042 Nontoxic multinodular goiter: Secondary | ICD-10-CM | POA: Diagnosis not present

## 2019-10-02 DIAGNOSIS — F4321 Adjustment disorder with depressed mood: Secondary | ICD-10-CM | POA: Diagnosis not present

## 2019-10-02 MED ORDER — OLMESARTAN MEDOXOMIL-HCTZ 40-25 MG PO TABS: 1.0000 | ORAL_TABLET | Freq: Every day | ORAL | 1 refills | Status: DC

## 2019-10-02 MED ORDER — AMLODIPINE BESYLATE 5 MG PO TABS: ORAL_TABLET | ORAL | 1 refills | Status: DC

## 2019-10-03 ENCOUNTER — Encounter: Payer: Self-pay | Admitting: Family Medicine

## 2019-10-03 LAB — COMPREHENSIVE METABOLIC PANEL
ALT: 23 IU/L (ref 0–32)
AST: 23 IU/L (ref 0–40)
Albumin/Globulin Ratio: 1.6 (ref 1.2–2.2)
Albumin: 4.7 g/dL (ref 3.8–4.8)
Alkaline Phosphatase: 66 IU/L (ref 39–117)
BUN/Creatinine Ratio: 19 (ref 12–28)
BUN: 14 mg/dL (ref 8–27)
Bilirubin Total: 0.2 mg/dL (ref 0.0–1.2)
CO2: 24 mmol/L (ref 20–29)
Calcium: 10 mg/dL (ref 8.7–10.3)
Chloride: 99 mmol/L (ref 96–106)
Creatinine, Ser: 0.73 mg/dL (ref 0.57–1.00)
GFR calc Af Amer: 103 mL/min/{1.73_m2} (ref >59)
GFR calc non Af Amer: 89 mL/min/{1.73_m2} (ref >59)
Globulin, Total: 3 g/dL (ref 1.5–4.5)
Glucose: 107 mg/dL — ABNORMAL HIGH (ref 65–99)
Potassium: 3.8 mmol/L (ref 3.5–5.2)
Sodium: 139 mmol/L (ref 134–144)
Total Protein: 7.7 g/dL (ref 6.0–8.5)

## 2019-10-03 LAB — LIPID PANEL
Chol/HDL Ratio: 2.1 ratio (ref 0.0–4.4)
Cholesterol, Total: 206 mg/dL — ABNORMAL HIGH (ref 100–199)
HDL: 98 mg/dL (ref >39)
LDL Chol Calc (NIH): 96 mg/dL (ref 0–99)
Triglycerides: 67 mg/dL (ref 0–149)
VLDL Cholesterol Cal: 12 mg/dL (ref 5–40)

## 2019-10-03 LAB — CBC WITH DIFFERENTIAL/PLATELET
Basophils Absolute: 0 10*3/uL (ref 0.0–0.2)
Basos: 0 %
EOS (ABSOLUTE): 0.2 10*3/uL (ref 0.0–0.4)
Eos: 2 %
Hematocrit: 43.4 % (ref 34.0–46.6)
Hemoglobin: 13.9 g/dL (ref 11.1–15.9)
Immature Grans (Abs): 0 10*3/uL (ref 0.0–0.1)
Immature Granulocytes: 0 %
Lymphocytes Absolute: 2.3 10*3/uL (ref 0.7–3.1)
Lymphs: 29 %
MCH: 25.1 pg — ABNORMAL LOW (ref 26.6–33.0)
MCHC: 32 g/dL (ref 31.5–35.7)
MCV: 79 fL (ref 79–97)
Monocytes Absolute: 0.6 10*3/uL (ref 0.1–0.9)
Monocytes: 8 %
Neutrophils Absolute: 4.7 10*3/uL (ref 1.4–7.0)
Neutrophils: 61 %
Platelets: 278 10*3/uL (ref 150–450)
RBC: 5.53 x10E6/uL — ABNORMAL HIGH (ref 3.77–5.28)
RDW: 15.2 % (ref 11.7–15.4)
WBC: 7.8 10*3/uL (ref 3.4–10.8)

## 2019-10-03 LAB — TSH: TSH: 0.581 u[IU]/mL (ref 0.450–4.500)

## 2019-10-17 ENCOUNTER — Other Ambulatory Visit: Payer: Self-pay | Admitting: Family Medicine

## 2019-10-17 DIAGNOSIS — J45909 Unspecified asthma, uncomplicated: Secondary | ICD-10-CM

## 2019-10-17 MED FILL — OLMESARTAN-HCTZ 40-25 MG TA: 40-25 | 30 days supply | Qty: 30 | Fill #0

## 2019-11-14 IMAGING — US US THYROID
1 series · 13 of 25 positions shown · non-contrast
Comparison: 06/02/2014, 05/22/2014

CLINICAL DATA: 61-year-old female with thyroid nodules.

Prior biopsy performed 06/02/2014 of right thyroid nodule
EXAM:
THYROID ULTRASOUND
TECHNIQUE: Ultrasound examination of the thyroid gland and adjacent soft
tissues was performed.

[Series 1: us thyroid · 0.05mm/px · 13 of 59 slices shown]
[im 1/59]
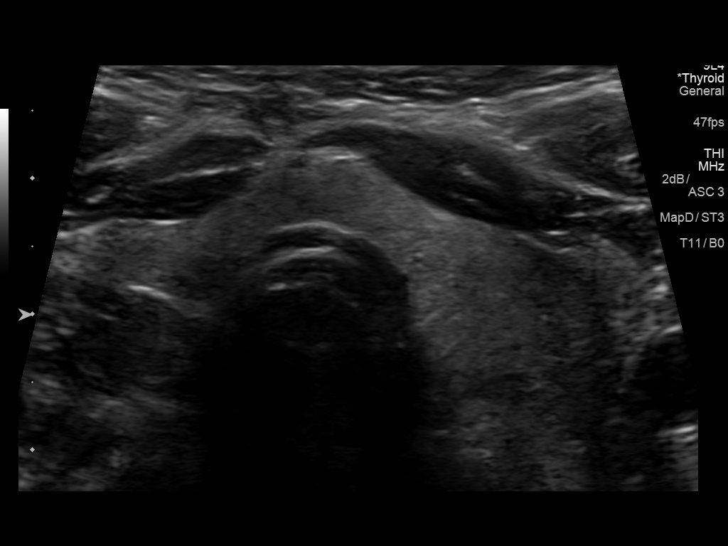
[im 5/59]
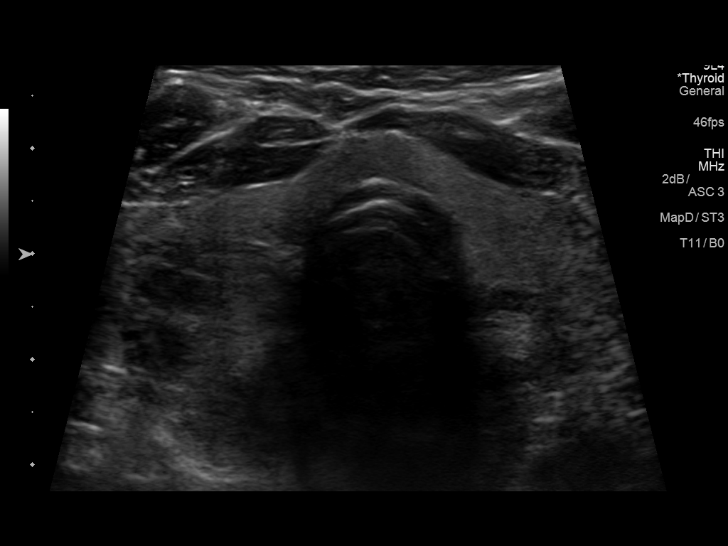
[im 10/59]
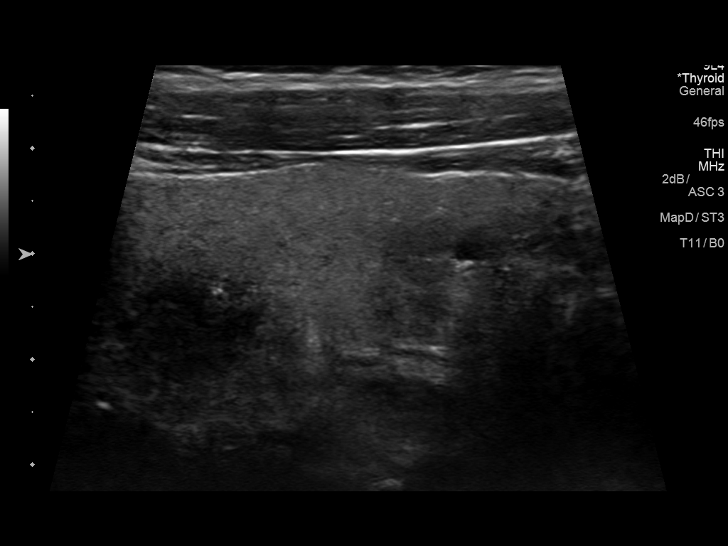
[im 15/59]
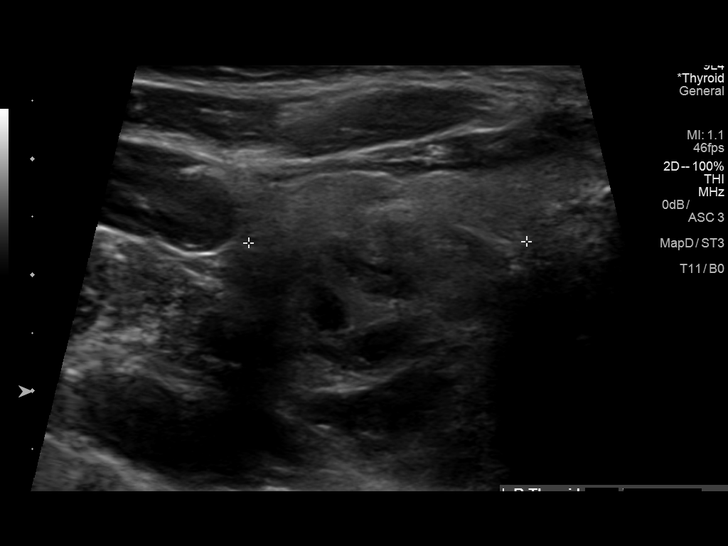
[im 20/59]
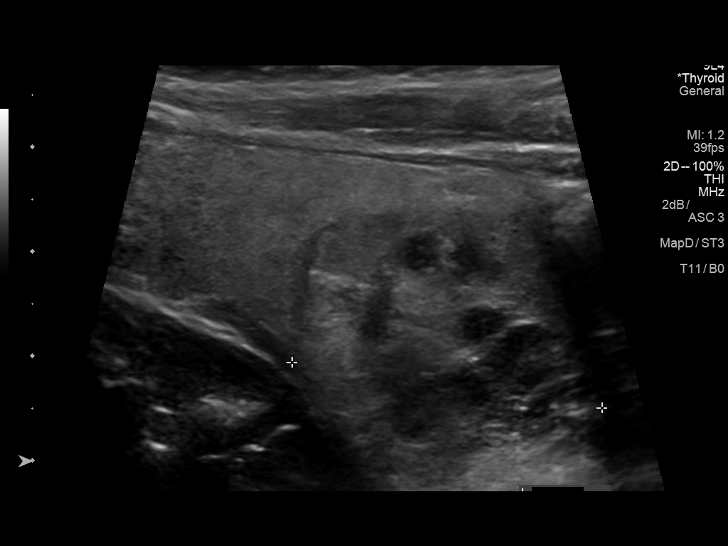
[im 25/59]
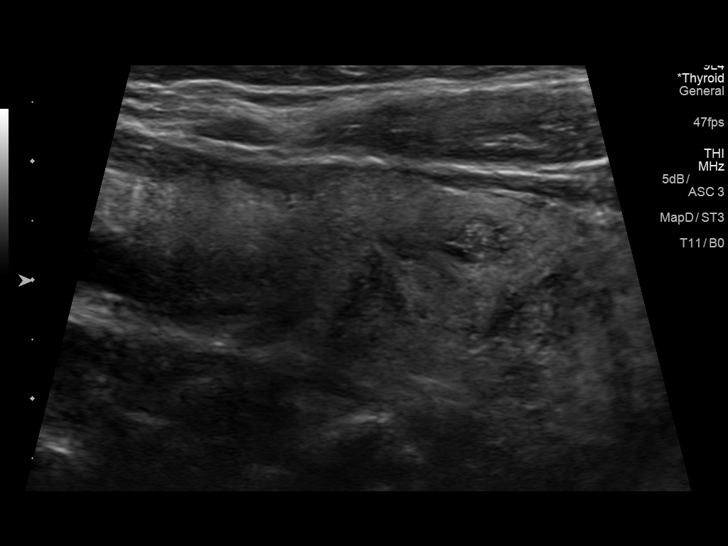
[im 30/59]
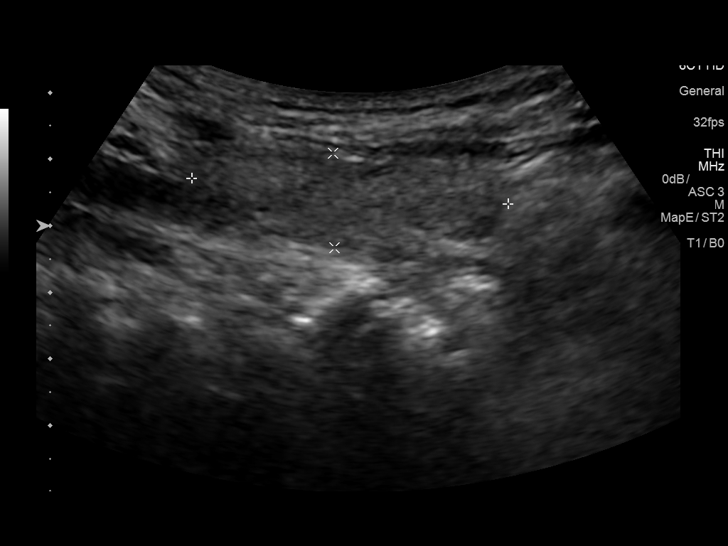
[im 34/59]
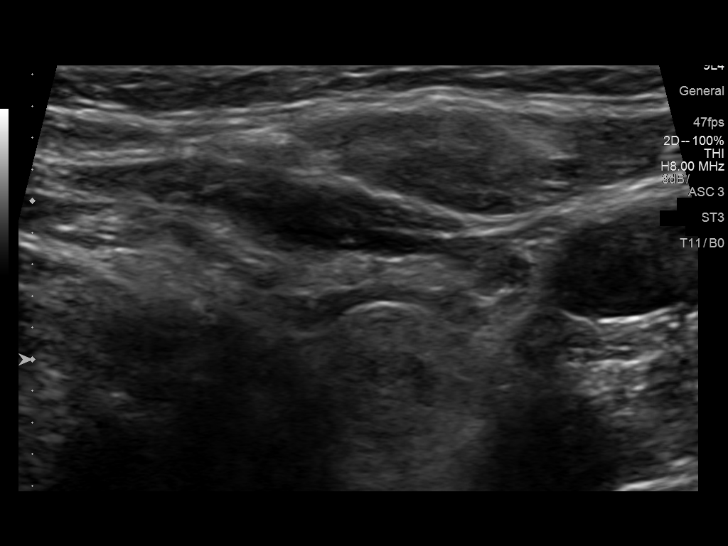
[im 39/59]
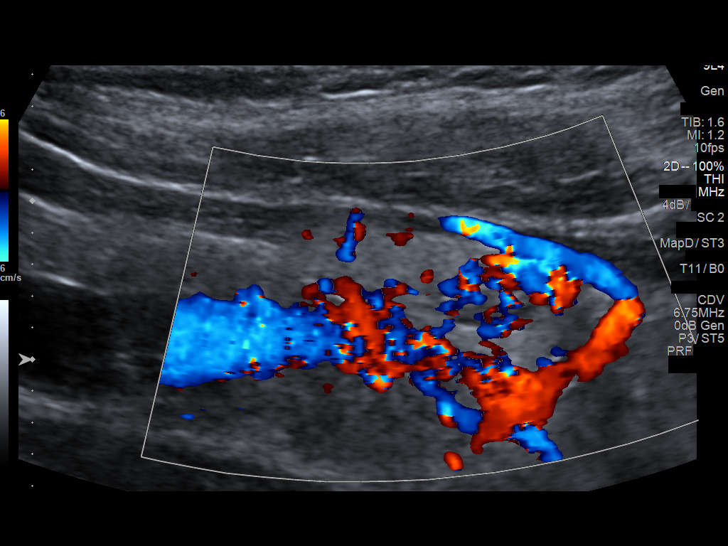
[im 44/59]
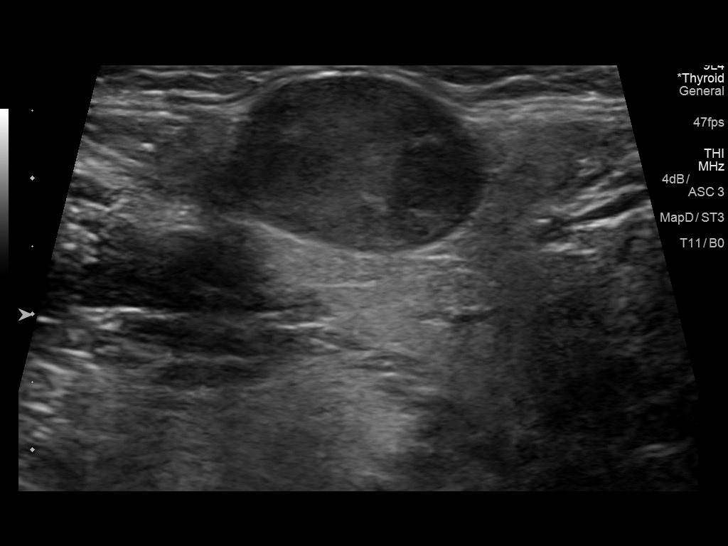
[im 49/59]
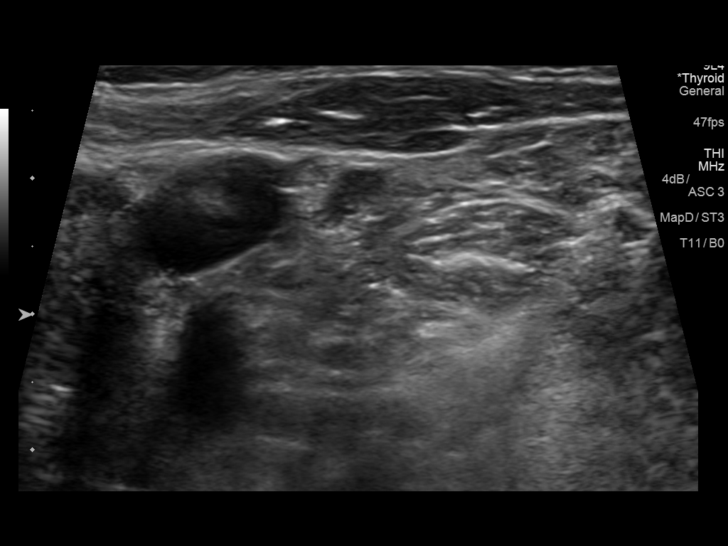
[im 54/59]
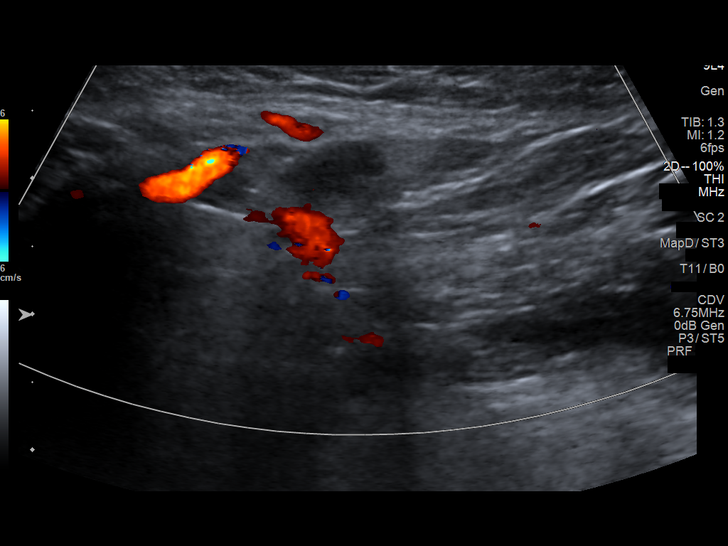
[im 59/59]
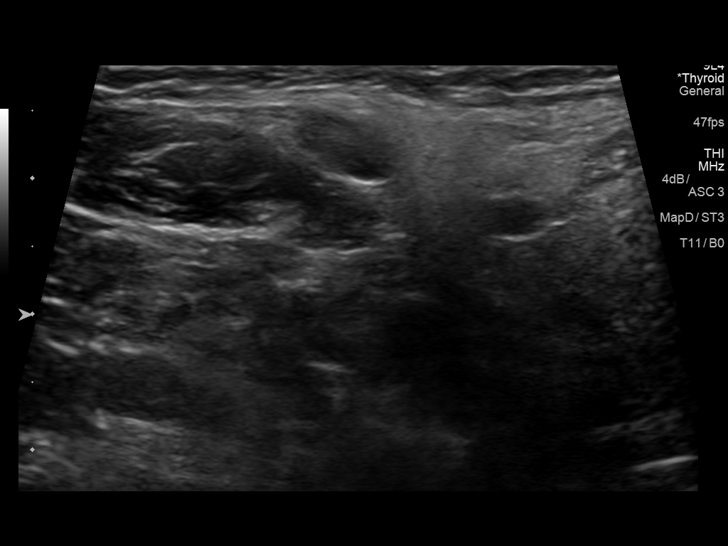

[13 of 25 positions shown; findings below may reference images not displayed]

FINDINGS: Parenchymal Echotexture: Mildly heterogenous

Isthmus: 0.5 cm

Right lobe: 6.4 cm x 3.2 cm x 2.4 cm

Left lobe: 4.8 cm x 1.6 cm x 1.6 cm

_________________________________________________________

Estimated total number of nodules >/= 1 cm: 1

Number of spongiform nodules >/=  2 cm not described below (TR1): 0

Number of mixed cystic and solid nodules >/= 1.5 cm not described
below (TR2): 0

_________________________________________________________

Right-sided thyroid nodule with mixed cystic and solid features,
previously biopsied 06/02/2014.

Nodule # 2:

Location: Left; Inferior

Maximum size: 0.7 cm; Other 2 dimensions: 0.6 cm x 0.4 cm

Composition: cannot determine (2)

Echogenicity: hypoechoic (2)

Shape: not taller-than-wide (0)

Margins: smooth (0)

Echogenic foci: none (0)

ACR TI-RADS total points: 4.

ACR TI-RADS risk category: TR4 (4-6 points).

ACR TI-RADS recommendations:

Nodule does not meet criteria for surveillance or biopsy

_________________________________________________________

No adenopathy
IMPRESSION: Left inferior thyroid nodule does not meet criteria for biopsy or
surveillance, as designated by the newly established ACR TI-RADS
criteria.

The right thyroid nodule has been previously biopsied. If the prior
biopsy was benign, updated ACR criteria recommend no specific
further follow-up.

Recommendations follow those established by the new ACR TI-RADS
criteria ([HOSPITAL] 4220;[DATE]).

## 2019-11-17 MED FILL — OLMESARTAN-HCTZ 40-25 MG TA: 40-25 | 30 days supply | Qty: 30 | Fill #1

## 2019-11-17 MED FILL — AMLODIPINE BESYLATE 5 MG TA: 5 | 90 days supply | Qty: 135 | Fill #0

## 2019-12-16 ENCOUNTER — Other Ambulatory Visit: Payer: Self-pay | Admitting: Family Medicine

## 2019-12-16 DIAGNOSIS — J45909 Unspecified asthma, uncomplicated: Secondary | ICD-10-CM

## 2019-12-16 MED FILL — MONTELUKAST SOD 10 MG TAB: 10 | 90 days supply | Qty: 90 | Fill #0

## 2019-12-16 MED FILL — OLMESARTAN-HCTZ 40-25 MG TA: 40-25 | 30 days supply | Qty: 30 | Fill #2

## 2020-01-15 MED FILL — OLMESARTAN-HCTZ 40-25 MG TA: 40-25 | 30 days supply | Qty: 30 | Fill #3

## 2020-02-13 MED FILL — AMLODIPINE BESYLATE 5 MG TA: 5 | 90 days supply | Qty: 135 | Fill #1

## 2020-02-13 MED FILL — OLMESARTAN-HCTZ 40-25 MG TA: 40-25 | 30 days supply | Qty: 30 | Fill #4

## 2020-03-15 ENCOUNTER — Other Ambulatory Visit: Payer: Self-pay | Admitting: Family Medicine

## 2020-03-15 DIAGNOSIS — J45909 Unspecified asthma, uncomplicated: Secondary | ICD-10-CM

## 2020-03-15 MED FILL — MONTELUKAST SOD 10 MG TAB: 10 | 90 days supply | Qty: 90 | Fill #0

## 2020-03-15 MED FILL — OLMESARTAN-HCTZ 40-25 MG TA: 40-25 | 30 days supply | Qty: 30 | Fill #5

## 2020-03-31 NOTE — Progress Notes (Signed)
Chief Complaint  Patient presents with  . Hypertension    follow up on hypertension-6 month.    Patient presents for 6 month follow-up. At her visit in October she was somewhat depressed, related to many losses.  We had discussed grief counseling, therapy, medication. At her last visit, she reported decreased appetite, only having 1 meal/day. Anniversaries of the deaths are coming up, family relies on each other, talk it out. She reports her moods have improved since last visit.  She was referred to ENT for evaluation of mass at L side of neck. She previously had thyroid US and biopsy of nodule on R thyroid, no evaluation of the left sided mass.  No adenopathy was noted on last [place], no mention of this mass. In January we were notified that Dr. Suszanne Conners tried to contact patient x 3 times. I called pt and she is not able to schedule due to finances- she said maybe some other time. Her insurance had changed in the fall.  Is having some expensive dental work. She has been having pain on both sides of her mouth, and plan is to pull 16 teeth and get dentures.  Hypertension:She continues onAmlodipine (7.5mg  daily)andolmesartan-HCT. She denies any side effects.She denies headaches, dizziness, chest pain, palpitations, muscle cramps. Her BP's are running 112-144 (once 155)/74-89, mostly upper 120's-130's/80's, was in the 140's-150 for just a few days related to dental pain, the week of 4/9.  Has come down since then, 121/87 this morning.  Tries to limit salt in her diet. (limits/avoidslunchmeat, sausage, french fries, chips, bacon. No canned foods)  Asthma/allergies: She is using the Singulair daily.  She uses albuterol prn, usually about once everyfewmonths,during allergy season, but hasn't need to use any yet. She has not been needing any flonase (uses it prn, hasn't needed), and doesn't take antihistamines. Shefeels like her asthma and allergies are controlled.Last spirometry wasnormal in  09/2018.  Obesity:She is stillair-frying her food(chicken, pork chop). She gets headaches from aspartame.She is tolerating Splenda in her coffee, also tolerates Steevia. Tolerated Fresca in the past. No longer having regular meals as she had been per last visit.  She still doesn't have much of an appetite.  Had 2 boiled eggs and coffee for breakast, may not eat again until 5-6 pm.  In 09/2019 she was noted to have elevated fasting glucose of 107. Has a can of gingerale most days (used to have just 1/2 can, and also would have some sugar-free Fresca, but no longer drinking that). Doesn't eat much bread, occasional baked potato, occasional sweets.  PMH, PSH, SH reviewed  Outpatient Encounter Medications as of 04/01/2020  Medication Sig Note  . amLODipine (NORVASC) 5 MG tablet TAKE 1 & 1/2 TABLETS (7.5 MG TOTAL) BY MOUTH DAILY.   Marland Kitchen aspirin 81 MG tablet Take 81 mg by mouth daily.   . montelukast (SINGULAIR) 10 MG tablet TAKE 1 TABLET BY MOUTH AT BEDTIME.   . Multiple Vitamins-Minerals (CENTRUM SILVER PO) Take 1 tablet by mouth daily.   Marland Kitchen olmesartan-hydrochlorothiazide (BENICAR HCT) 40-25 MG tablet Take 1 tablet by mouth daily.   . [DISCONTINUED] amLODipine (NORVASC) 5 MG tablet TAKE 1 & 1/2 TABLETS (7.5 MG TOTAL) BY MOUTH DAILY.   . [DISCONTINUED] olmesartan-hydrochlorothiazide (BENICAR HCT) 40-25 MG tablet Take 1 tablet by mouth daily.   Marland Kitchen albuterol (VENTOLIN HFA) 108 (90 Base) MCG/ACT inhaler INHALE 2 PUFFS INTO THE LUNGS EVERY 6 HOURS AS NEEDED FOR WHEEZING OR SHORTNESS OF BREATH. (Patient not taking: Reported on 09/30/2018)   .  fluticasone (FLONASE) 50 MCG/ACT nasal spray PLACE 2 SPRAYS INTO BOTH NOSTRILS DAILY. (Patient not taking: Reported on 10/02/2019) 04/01/2020: Uses it only prn  . ibuprofen (ADVIL,MOTRIN) 800 MG tablet Take 1 tablet (800 mg total) by mouth 3 (three) times daily. (Patient not taking: Reported on 09/30/2018)    No facility-administered encounter medications on file  as of 04/01/2020.   Allergies  Allergen Reactions  . Codeine Other (See Comments)    headache  . Penicillins Other (See Comments)    headache  . Sinus & Allergy [Pseudoephedrine] Other (See Comments)    Sinus medications do not work for her, they dry her up so badly that they give her asthma attacks.   ROS: no fever, chlls, headaches, dizziness, chest pain, shortness of breath, URI symptoms, GI or GU complaints.  Denies any pain/discomfort with lump on the neck. +dental pain.  No edema. Moods are improved, thinks they might get a little worse with additional anniversary of deaths from last year, but feels like she has good support from her family.   PHYSICAL EXAM:  BP 130/80   Pulse 80   Temp 98.2 F (36.8 C) (Tympanic)   Ht 5\' 1"  (1.549 m)   Wt 208 lb 6.4 oz (94.5 kg)   BMI 39.38 kg/m   Wt Readings from Last 3 Encounters:  04/01/20 208 lb 6.4 oz (94.5 kg)  10/02/19 211 lb 3.2 oz (95.8 kg)  09/30/18 208 lb 9.6 oz (94.6 kg)   Pleasant, well-appearing, obese female in good spirits. HEENT: conjunctiva and sclera are clear, EOMI. Wearing mask Neck: no lymphadenopathy. Thyroid mildly enlarged, R>L, no dominant mass.  Nontender, mobile submandibularmassis present on the left. This has increased in size, no measuring 2 x 2.5cm (prev 1.5 x 2cm) Heart: regular rate and rhythm Lungs: clear bilaterally Back: no spinal or CVA tenderness Abdomen: obese, soft, nontender Extremities: no edema Psych: normal mood, affect, hygiene and grooming Neuro: alert and oriented, normal strength, gait  Lab Results  Component Value Date   HGBA1C 5.7 (A) 04/01/2020    ASSESSMENT/PLAN:  Essential hypertension, benign - controlled on current regimen, occ has higher values (ie w/dental pain). Low Na diet, exercise and wt loss rec. Cont current meds - Plan: amLODipine (NORVASC) 5 MG tablet, olmesartan-hydrochlorothiazide (BENICAR HCT) 40-25 MG tablet  Impaired fasting glucose - counseled re:  proper diet, exercise, weight loss.  To stop drinking regular sodas - Plan: HgB A1c  Mass of left side of neck - This has increased in size from last visit. Strongly encouraged ENT consult; declines at this time, will let us know. Risks of waiting reviewed  Severe obesity (BMI 35.0-39.9) with comorbidity ([place]) - counseled re: risks, diet, weight loss  Discussed reasons for ENT eval in detail, risk of waiting. She feels like she would know if something was wrong--tried to explain this in detail, and encourage eval since there has been a clear increase in size since visit 6 months ago. "I will let you know" Declines ENT at this time  F/u at CPE as scheduled, sooner prn.

## 2020-04-01 ENCOUNTER — Ambulatory Visit (INDEPENDENT_AMBULATORY_CARE_PROVIDER_SITE_OTHER): Payer: 59 | Admitting: Family Medicine

## 2020-04-01 ENCOUNTER — Other Ambulatory Visit: Payer: Self-pay

## 2020-04-01 ENCOUNTER — Encounter: Payer: Self-pay | Admitting: Family Medicine

## 2020-04-01 VITALS — BP 130/80 | HR 80 | Temp 98.2°F | Ht 61.0 in | Wt 208.4 lb

## 2020-04-01 DIAGNOSIS — R7301 Impaired fasting glucose: Secondary | ICD-10-CM | POA: Diagnosis not present

## 2020-04-01 DIAGNOSIS — R221 Localized swelling, mass and lump, neck: Secondary | ICD-10-CM

## 2020-04-01 DIAGNOSIS — I10 Essential (primary) hypertension: Secondary | ICD-10-CM

## 2020-04-01 LAB — POCT GLYCOSYLATED HEMOGLOBIN (HGB A1C): Hemoglobin A1C: 5.7 % — AB (ref 4.0–5.6)

## 2020-04-01 MED ORDER — OLMESARTAN MEDOXOMIL-HCTZ 40-25 MG PO TABS: 1.0000 | ORAL_TABLET | Freq: Every day | ORAL | 1 refills | Status: DC

## 2020-04-01 MED ORDER — AMLODIPINE BESYLATE 5 MG PO TABS: ORAL_TABLET | ORAL | 1 refills | Status: DC

## 2020-04-01 NOTE — Patient Instructions (Addendum)
Please cut out the regular sodas. Please try and eat something mid-day, even if just yogurt and fruit (snack vs an entire meal).  Continue your current medications. If allergies are not adequately controlled with Singulair alone, you can add an anithistamine such as claritin or allegra or zyrtec. If you start the flonase, you should use it regularly (daily).  The lump on your left neck has increased in size by 1/2 cm since 09/2019.  I strongly recommend seeing ENT for further evaluation. You declined referral today.  Please let us know when you are ready so we can set up an appointment for a consultation.   Prediabetes Eating Plan Prediabetes is a condition that causes blood sugar (glucose) levels to be higher than normal. This increases the risk for developing diabetes. In order to prevent diabetes from developing, your health care provider may recommend a diet and other lifestyle changes to help you:  Control your blood glucose levels.  Improve your cholesterol levels.  Manage your blood pressure. Your health care provider may recommend working with a diet and nutrition specialist (dietitian) to make a meal plan that is best for you. What are tips for following this plan? Lifestyle  Set weight loss goals with the help of your health care team. It is recommended that most people with prediabetes lose 7% of their current body weight.  Exercise for at least 30 minutes at least 5 days a week.  Attend a support group or seek ongoing support from a mental health counselor.  Take over-the-counter and prescription medicines only as told by your health care provider. Reading food labels  Read food labels to check the amount of fat, salt (sodium), and sugar in prepackaged foods. Avoid foods that have: ? Saturated fats. ? Trans fats. ? Added sugars.  Avoid foods that have more than 300 milligrams (mg) of sodium per serving. Limit your daily sodium intake to less than 2,300 mg each  day. Shopping  Avoid buying pre-made and processed foods. Cooking  Cook with olive oil. Do not use butter, lard, or ghee.  Bake, broil, grill, or boil foods. Avoid frying. Meal planning   Work with your dietitian to develop an eating plan that is right for you. This may include: ? Tracking how many calories you take in. Use a food diary, notebook, or mobile application to track what you eat at each meal. ? Using the glycemic index (GI) to plan your meals. The index tells you how quickly a food will raise your blood glucose. Choose low-GI foods. These foods take a longer time to raise blood glucose.  Consider following a Mediterranean diet. This diet includes: ? Several servings each day of fresh fruits and vegetables. ? Eating fish at least twice a week. ? Several servings each day of whole grains, beans, nuts, and seeds. ? Using olive oil instead of other fats. ? Moderate alcohol consumption. ? Eating small amounts of red meat and whole-fat dairy.  If you have high blood pressure, you may need to limit your sodium intake or follow a diet such as the DASH eating plan. DASH is an eating plan that aims to lower high blood pressure. What foods are recommended? The items listed below may not be a complete list. Talk with your dietitian about what dietary choices are best for you. Grains Whole grains, such as whole-wheat or whole-grain breads, crackers, cereals, and pasta. Unsweetened oatmeal. Bulgur. Barley. Quinoa. Brown rice. Corn or whole-wheat flour tortillas or taco shells. Vegetables Lettuce. Spinach.  Peas. Beets. Cauliflower. Cabbage. Broccoli. Carrots. Tomatoes. Squash. Eggplant. Herbs. Peppers. Onions. Cucumbers. Brussels sprouts. Fruits Berries. Bananas. Apples. Oranges. Grapes. Papaya. Mango. Pomegranate. Kiwi. Grapefruit. Cherries. Meats and other protein foods Seafood. Poultry without skin. Lean cuts of pork and beef. Tofu. Eggs. Nuts. Beans. Dairy Low-fat or fat-free  dairy products, such as yogurt, cottage cheese, and cheese. Beverages Water. Tea. Coffee. Sugar-free or diet soda. Seltzer water. Lowfat or no-fat milk. Milk alternatives, such as soy or almond milk. Fats and oils Olive oil. Canola oil. Sunflower oil. Grapeseed oil. Avocado. Walnuts. Sweets and desserts Sugar-free or low-fat pudding. Sugar-free or low-fat ice cream and other frozen treats. Seasoning and other foods Herbs. Sodium-free spices. Mustard. Relish. Low-fat, low-sugar ketchup. Low-fat, low-sugar barbecue sauce. Low-fat or fat-free mayonnaise. What foods are not recommended? The items listed below may not be a complete list. Talk with your dietitian about what dietary choices are best for you. Grains Refined white flour and flour products, such as bread, pasta, snack foods, and cereals. Vegetables Canned vegetables. Frozen vegetables with butter or cream sauce. Fruits Fruits canned with syrup. Meats and other protein foods Fatty cuts of meat. Poultry with skin. Breaded or fried meat. Processed meats. Dairy Full-fat yogurt, cheese, or milk. Beverages Sweetened drinks, such as sweet iced tea and soda. Fats and oils Butter. Lard. Ghee. Sweets and desserts Baked goods, such as cake, cupcakes, pastries, cookies, and cheesecake. Seasoning and other foods Spice mixes with added salt. Ketchup. Barbecue sauce. Mayonnaise. Summary  To prevent diabetes from developing, you may need to make diet and other lifestyle changes to help control blood sugar, improve cholesterol levels, and manage your blood pressure.  Set weight loss goals with the help of your health care team. It is recommended that most people with prediabetes lose 7 percent of their current body weight.  Consider following a Mediterranean diet that includes plenty of fresh fruits and vegetables, whole grains, beans, nuts, seeds, fish, lean meat, low-fat dairy, and healthy oils. This information is not intended to replace  advice given to you by your health care provider. Make sure you discuss any questions you have with your health care provider. Document Revised: 03/21/2019 Document Reviewed: 01/31/2017 Elsevier Patient Education  2020 [employer].

## 2020-04-15 ENCOUNTER — Other Ambulatory Visit: Payer: Self-pay | Admitting: Family Medicine

## 2020-04-15 DIAGNOSIS — J45909 Unspecified asthma, uncomplicated: Secondary | ICD-10-CM

## 2020-04-15 MED FILL — OLMESARTAN-HCTZ 40-25 MG TA: 40-25 | 90 days supply | Qty: 90 | Fill #0

## 2020-05-27 MED FILL — AMLODIPINE BESYLATE 5 MG TA: 5 | 90 days supply | Qty: 135 | Fill #0

## 2020-06-23 MED FILL — MONTELUKAST SOD 10 MG TAB: 10 | 90 days supply | Qty: 90 | Fill #0

## 2020-07-06 DIAGNOSIS — Z1231 Encounter for screening mammogram for malignant neoplasm of breast: Secondary | ICD-10-CM | POA: Diagnosis not present

## 2020-07-09 ENCOUNTER — Encounter: Payer: Self-pay | Admitting: Family Medicine

## 2020-07-14 MED FILL — OLMESARTAN-HCTZ 40-25 MG TA: 40-25 | 90 days supply | Qty: 90 | Fill #1

## 2020-07-23 DIAGNOSIS — R921 Mammographic calcification found on diagnostic imaging of breast: Secondary | ICD-10-CM | POA: Diagnosis not present

## 2020-07-23 LAB — HM MAMMOGRAPHY

## 2020-07-27 ENCOUNTER — Encounter: Payer: Self-pay | Admitting: Family Medicine

## 2020-08-09 ENCOUNTER — Other Ambulatory Visit: Payer: Self-pay | Admitting: Radiology

## 2020-08-09 DIAGNOSIS — N6012 Diffuse cystic mastopathy of left breast: Secondary | ICD-10-CM | POA: Diagnosis not present

## 2020-08-09 DIAGNOSIS — R921 Mammographic calcification found on diagnostic imaging of breast: Secondary | ICD-10-CM | POA: Diagnosis not present

## 2020-08-09 HISTORY — PX: BREAST BIOPSY: SHX20

## 2020-08-12 ENCOUNTER — Encounter: Payer: Self-pay | Admitting: Internal Medicine

## 2020-08-26 MED FILL — AMLODIPINE BESYLATE 5 MG TA: 5 | 90 days supply | Qty: 135 | Fill #1

## 2020-09-08 ENCOUNTER — Encounter: Payer: Self-pay | Admitting: Family Medicine

## 2020-09-23 MED FILL — MONTELUKAST SOD 10 MG TAB: 10 | 90 days supply | Qty: 90 | Fill #1

## 2020-10-13 ENCOUNTER — Other Ambulatory Visit: Payer: Self-pay | Admitting: Family Medicine

## 2020-10-13 DIAGNOSIS — I10 Essential (primary) hypertension: Secondary | ICD-10-CM

## 2020-10-13 MED FILL — OLMESARTAN-HCTZ 40-25 MG TA: 40-25 | 90 days supply | Qty: 90 | Fill #0

## 2020-10-13 MED FILL — ALBUTEROL SULFATE HFA 108 (: 108 (90 BAS | 25 days supply | Qty: 18 | Fill #0

## 2020-10-13 NOTE — Telephone Encounter (Signed)
Patient did request albuterol, she doesn't use often but the weather is getting colder and she would like to have on hand. I refilled her blood pressure medication as she said she will run out on Friday. She will be here next Thursday for her CPE.
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
## 2020-10-21 ENCOUNTER — Other Ambulatory Visit: Payer: Self-pay | Admitting: Family Medicine

## 2020-10-21 ENCOUNTER — Encounter: Payer: Self-pay | Admitting: Family Medicine

## 2020-10-21 ENCOUNTER — Ambulatory Visit (INDEPENDENT_AMBULATORY_CARE_PROVIDER_SITE_OTHER): Payer: 59 | Admitting: Family Medicine

## 2020-10-21 ENCOUNTER — Other Ambulatory Visit: Payer: Self-pay

## 2020-10-21 VITALS — BP 140/80 | HR 84 | Ht 60.5 in | Wt 218.0 lb

## 2020-10-21 DIAGNOSIS — E042 Nontoxic multinodular goiter: Secondary | ICD-10-CM | POA: Diagnosis not present

## 2020-10-21 DIAGNOSIS — J45909 Unspecified asthma, uncomplicated: Secondary | ICD-10-CM

## 2020-10-21 DIAGNOSIS — J309 Allergic rhinitis, unspecified: Secondary | ICD-10-CM | POA: Diagnosis not present

## 2020-10-21 DIAGNOSIS — R7301 Impaired fasting glucose: Secondary | ICD-10-CM

## 2020-10-21 DIAGNOSIS — I10 Essential (primary) hypertension: Secondary | ICD-10-CM | POA: Diagnosis not present

## 2020-10-21 DIAGNOSIS — Z0000 Encounter for general adult medical examination without abnormal findings: Secondary | ICD-10-CM

## 2020-10-21 DIAGNOSIS — R221 Localized swelling, mass and lump, neck: Secondary | ICD-10-CM

## 2020-10-21 DIAGNOSIS — R635 Abnormal weight gain: Secondary | ICD-10-CM

## 2020-10-21 DIAGNOSIS — Z6841 Body Mass Index (BMI) 40.0 and over, adult: Secondary | ICD-10-CM

## 2020-10-21 LAB — POCT URINALYSIS DIP (PROADVANTAGE DEVICE)
Bilirubin, UA: NEGATIVE
Blood, UA: NEGATIVE
Glucose, UA: NEGATIVE mg/dL
Ketones, POC UA: NEGATIVE mg/dL
Nitrite, UA: NEGATIVE
Protein Ur, POC: NEGATIVE mg/dL
Specific Gravity, Urine: 1.03
Urobilinogen, Ur: NEGATIVE
pH, UA: 6 (ref 5.0–8.0)

## 2020-10-21 LAB — POCT GLYCOSYLATED HEMOGLOBIN (HGB A1C): Hemoglobin A1C: 5.6 % (ref 4.0–5.6)

## 2020-10-21 LAB — LIPID PANEL

## 2020-10-21 MED ORDER — MONTELUKAST SODIUM 10 MG PO TABS: 10.0000 mg | ORAL_TABLET | Freq: Every day | ORAL | 3 refills | Status: DC

## 2020-10-21 MED ORDER — ALBUTEROL SULFATE HFA 108 (90 BASE) MCG/ACT IN AERS: INHALATION_SPRAY | RESPIRATORY_TRACT | 1 refills | Status: DC

## 2020-10-21 MED ORDER — AMLODIPINE BESYLATE 5 MG PO TABS: ORAL_TABLET | ORAL | 1 refills | Status: DC

## 2020-10-21 MED ORDER — FLUTICASONE PROPIONATE 50 MCG/ACT NA SUSP: 2.0000 | Freq: Every day | NASAL | 4 refills | Status: DC

## 2020-10-21 MED FILL — FLUTICASONE PROP 50 MCG SPR: 50 | 30 days supply | Qty: 16 | Fill #0

## 2020-10-21 NOTE — Patient Instructions (Addendum)
  HEALTH MAINTENANCE RECOMMENDATIONS:  It is recommended that you get at least 30 minutes of aerobic exercise at least 5 days/week (for weight loss, you may need as much as 60-90 minutes). This can be any activity that gets your heart rate up. This can be divided in 10-15 minute intervals if needed, but try and build up your endurance at least once a week.  Weight bearing exercise is also recommended twice weekly.  Eat a healthy diet with lots of vegetables, fruits and fiber.  "Colorful" foods have a lot of vitamins (ie green vegetables, tomatoes, red peppers, etc).  Limit sweet tea, regular sodas and alcoholic beverages, all of which has a lot of calories and sugar.  Up to 1 alcoholic drink daily may be beneficial for women (unless trying to lose weight, watch sugars).  Drink a lot of water.  Calcium recommendations are 1200-1500 mg daily (1500 mg for postmenopausal women or women without ovaries), and vitamin D 1000 IU daily.  This should be obtained from diet and/or supplements (vitamins), and calcium should not be taken all at once, but in divided doses.  Monthly self breast exams and yearly mammograms for women over the age of 61 is recommended.  Sunscreen of at least SPF 30 should be used on all sun-exposed parts of the skin when outside between the hours of 10 am and 4 pm (not just when at beach or pool, but even with exercise, golf, tennis, and yard work!)  Use a sunscreen that says "broad spectrum" so it covers both UVA and UVB rays, and make sure to reapply every 1-2 hours.  Remember to change the batteries in your smoke detectors when changing your clock times in the spring and fall. Carbon monoxide detectors are recommended for your home.  Use your seat belt every time you are in a car, and please drive safely and not be distracted with cell phones and texting while driving.  Please work on finding a routine for exercise (use your exercise bike and weights you have). Please be sure to  eat regularly throughout the day, a well-balanced diet. I encourage you to cook more food on the days you cook, so that you will have healthy options available as left-overs on the other days.  I recommend getting the new shingles vaccine (Shingrix). If you have commercial insurance, check with your insurance to verify what your out of pocket cost may be (usually covered as preventative, but better to verify to avoid any surprises, as this vaccine is expensive), and then schedule a nurse visit at our office when convenient (based on the possible side effects as discussed).   This is a series of 2 injections, spaced 2 months apart.  It doesn't have to be exactly 2 months apart (but can't be under 2 months), if that isn't feasible for your schedule, but try and get them close to 2 months (and definitely within 6 months of each other, or else the efficacy of the vaccine drops off).

## 2020-10-22 LAB — CBC WITH DIFFERENTIAL/PLATELET
Basophils Absolute: 0.1 10*3/uL (ref 0.0–0.2)
Basos: 1 %
EOS (ABSOLUTE): 0.1 10*3/uL (ref 0.0–0.4)
Eos: 1 %
Hematocrit: 42 % (ref 34.0–46.6)
Hemoglobin: 13.8 g/dL (ref 11.1–15.9)
Immature Grans (Abs): 0 10*3/uL (ref 0.0–0.1)
Immature Granulocytes: 0 %
Lymphocytes Absolute: 2.3 10*3/uL (ref 0.7–3.1)
Lymphs: 32 %
MCH: 25.8 pg — ABNORMAL LOW (ref 26.6–33.0)
MCHC: 32.9 g/dL (ref 31.5–35.7)
MCV: 79 fL (ref 79–97)
Monocytes Absolute: 0.5 10*3/uL (ref 0.1–0.9)
Monocytes: 8 %
Neutrophils Absolute: 4.1 10*3/uL (ref 1.4–7.0)
Neutrophils: 58 %
Platelets: 274 10*3/uL (ref 150–450)
RBC: 5.34 x10E6/uL — ABNORMAL HIGH (ref 3.77–5.28)
RDW: 15.3 % (ref 11.7–15.4)
WBC: 7.1 10*3/uL (ref 3.4–10.8)

## 2020-10-22 LAB — COMPREHENSIVE METABOLIC PANEL
ALT: 22 IU/L (ref 0–32)
AST: 20 IU/L (ref 0–40)
Albumin/Globulin Ratio: 1.6 (ref 1.2–2.2)
Albumin: 4.7 g/dL (ref 3.8–4.8)
Alkaline Phosphatase: 67 IU/L (ref 44–121)
BUN/Creatinine Ratio: 18 (ref 12–28)
BUN: 13 mg/dL (ref 8–27)
Bilirubin Total: <0.2 mg/dL (ref 0.0–1.2)
CO2: 26 mmol/L (ref 20–29)
Calcium: 9.5 mg/dL (ref 8.7–10.3)
Chloride: 101 mmol/L (ref 96–106)
Creatinine, Ser: 0.73 mg/dL (ref 0.57–1.00)
GFR calc Af Amer: 102 mL/min/{1.73_m2} (ref >59)
GFR calc non Af Amer: 89 mL/min/{1.73_m2} (ref >59)
Globulin, Total: 2.9 g/dL (ref 1.5–4.5)
Glucose: 99 mg/dL (ref 65–99)
Potassium: 3.7 mmol/L (ref 3.5–5.2)
Sodium: 143 mmol/L (ref 134–144)
Total Protein: 7.6 g/dL (ref 6.0–8.5)

## 2020-10-22 LAB — LIPID PANEL
Chol/HDL Ratio: 2.1 ratio (ref 0.0–4.4)
Cholesterol, Total: 244 mg/dL — ABNORMAL HIGH (ref 100–199)
HDL: 119 mg/dL (ref >39)
LDL Chol Calc (NIH): 112 mg/dL — ABNORMAL HIGH (ref 0–99)
Triglycerides: 78 mg/dL (ref 0–149)
VLDL Cholesterol Cal: 13 mg/dL (ref 5–40)

## 2020-10-22 LAB — TSH: TSH: 0.465 u[IU]/mL (ref 0.450–4.500)

## 2020-11-24 MED FILL — AMLODIPINE BESYLATE 5 MG TA: 5 | 90 days supply | Qty: 135 | Fill #0

## 2020-12-09 ENCOUNTER — Other Ambulatory Visit (INDEPENDENT_AMBULATORY_CARE_PROVIDER_SITE_OTHER): Payer: 59

## 2020-12-09 ENCOUNTER — Telehealth: Payer: 59 | Admitting: Family Medicine

## 2020-12-09 ENCOUNTER — Other Ambulatory Visit: Payer: Self-pay

## 2020-12-09 ENCOUNTER — Encounter: Payer: Self-pay | Admitting: Family Medicine

## 2020-12-09 VITALS — BP 162/97 | HR 107 | Ht 60.5 in | Wt 218.0 lb

## 2020-12-09 DIAGNOSIS — J309 Allergic rhinitis, unspecified: Secondary | ICD-10-CM | POA: Diagnosis not present

## 2020-12-09 DIAGNOSIS — R197 Diarrhea, unspecified: Secondary | ICD-10-CM | POA: Diagnosis not present

## 2020-12-09 DIAGNOSIS — R3989 Other symptoms and signs involving the genitourinary system: Secondary | ICD-10-CM | POA: Diagnosis not present

## 2020-12-09 DIAGNOSIS — R059 Cough, unspecified: Secondary | ICD-10-CM

## 2020-12-09 DIAGNOSIS — I10 Essential (primary) hypertension: Secondary | ICD-10-CM | POA: Diagnosis not present

## 2020-12-09 LAB — POC COVID19 BINAXNOW: SARS Coronavirus 2 Ag: NEGATIVE

## 2020-12-09 NOTE — Progress Notes (Signed)
Start time: 9:20 End time: 9:42  Attempted video visit-- [employer] phone, pt unable to access video  Virtual Visit via Telephone Note  I connected with Natasha Bauer on 12/09/20 by telephone and verified that I am speaking with the correct person using two identifiers.  Location: Patient: home Provider: office   I discussed the limitations of evaluation and management by telemedicine and the availability of in person appointments. The patient expressed understanding and agreed to proceed.  History of Present Illness:  Chief Complaint  Patient presents with  . Diarrhea    VIRTUAL Sunday had diarrhea and urinary pressure. Scratchy throat started yesterday and now some coughing when her throat itches. Sinus drainage. No fever, body or chills. Before the diarrhea started she had a lot of back pressure.    4 days ago she had severe back pain, followed by diarrhea.  Back pain resolved after diarrhea.  Bowels have returned to normal since then.  Denies any nausea or vomiting.  She had some pressure with urinating.  Urine is very clear, has just slight pressure.  Denies dysuria, odor, hematuria.  She is voiding frequently, but drinking a lot of water.  Yesterday she started with scratchy throat.  She used albuterol (thought related to smelling candles/scents through the holidays). She thinks it helped a little for the cough. She had stuffy nose Monday.  She has a cough related to dry throat, and is eased by drinking water.  She is having some itching in her ears, and some popping in her ears. She denies wheezing, chest tightness or shortness of breath. Mucus is clear, not thick. Denies any known fever or chills. Thermometer is broken.  She hasn't been using flonase. She takes singulair daily.  Denies sick contacts (family that was here for Christmas aren't ill)  She has had her COVID vaccines x 2, no booster yet.  PMH, PSH, SH reviewed  Outpatient Encounter Medications as of  12/09/2020  Medication Sig  . albuterol (VENTOLIN HFA) 108 (90 Base) MCG/ACT inhaler INHALE 2 PUFFS BY MOUTH INTO THE LUNGS EVERY 6 HOURS AS NEEDED FOR WHEEZING OR SHORTNESS OF BREATH.  Marland Kitchen amLODipine (NORVASC) 5 MG tablet TAKE 1 & 1/2 TABLETS (7.5 MG TOTAL) BY MOUTH DAILY.  Marland Kitchen aspirin 81 MG tablet Take 81 mg by mouth daily.  . montelukast (SINGULAIR) 10 MG tablet Take 1 tablet (10 mg total) by mouth at bedtime.  . Multiple Vitamins-Minerals (CENTRUM SILVER PO) Take 1 tablet by mouth daily.  Marland Kitchen olmesartan-hydrochlorothiazide (BENICAR HCT) 40-25 MG tablet TAKE 1 TABLET BY MOUTH ONCE A DAY  . fluticasone (FLONASE) 50 MCG/ACT nasal spray Place 2 sprays into both nostrils daily. (Patient not taking: Reported on 12/09/2020)  . ibuprofen (ADVIL,MOTRIN) 800 MG tablet Take 1 tablet (800 mg total) by mouth 3 (three) times daily. (Patient not taking: No sig reported)   No facility-administered encounter medications on file as of 12/09/2020.   Hasn't taken BP med yet today.  Allergies  Allergen Reactions  . Codeine Other (See Comments)    headache  . Penicillins Other (See Comments)    headache  . Sinus & Allergy [Pseudoephedrine] Other (See Comments)    Sinus medications do not work for her, they dry her up so badly that they give her asthma attacks.   ROS: no fever, chills, chest pain, shortness of breath.  URI and GI symptoms per HPI.  No bleeding, bruising, rash, dizziness, headache.    Observations/Objective:  BP (!) 162/97   Pulse Marland Kitchen)  107   Ht 5' 0.5" (1.537 m)   Wt 218 lb (98.9 kg)   BMI 41.87 kg/m   Patient is alert, oriented, in good spirits.  She doesn't sound congested, no throat-clearing or coughing during visit.  She is in no distress Exam is limited due to virtual nature of the visit.  Assessment and Plan:   Cough - suspect related to PND from allergies. guaifenesin/DM prn if needed.  Check COVID given symptoms of URI and diarrhea  Diarrhea, unspecified type - resolved.  Check COVID test given her various symptoms.  Allergic rhinitis, unspecified seasonality, unspecified trigger - continue singulair. Recommended antihistamine such as claritin/zyrtec  Essential hypertension, benign - BP elevated today; encouraged her to take her medication and monitor BP's  Sensation of pressure in bladder area - reviewed s/sx UTI, and to f/u if persist/worsening (abnl u/a when asymptomatic last month)   Start taking claritin or zyrtec, in addition to your singulair. Continue to stay well hydrated. Use mucinex DM or robitussin DM if your cough is getting worse, or if your mucus is thick.  If you continue to have urinary symptoms, we can then decide about either checking a urine specimen, vs treating presumptively. Part of your current symptoms is frequency, likely related to the quantity of water you're drinking. If you develop fever, flank pain (side pain at your pack), if you develop cloudy urine, odor to the urine or blood in the urine, please let us know.  COVID booster is recommended.  Once you are feeling better, schedule a nurse visit for the booster (assuming that this isn't COVID right now; if this is COVID, you can hold off longer for the booster).  Blood pressure is high today.  Periodically monitor. Continue your blood pressure medication.   Addendum: COVID negative rapid test  Follow Up Instructions:    I discussed the assessment and treatment plan with the patient. The patient was provided an opportunity to ask questions and all were answered. The patient agreed with the plan and demonstrated an understanding of the instructions.   The patient was advised to call back or seek an in-person evaluation if the symptoms worsen or if the condition fails to improve as anticipated.  I spent 24 minutes dedicated to the care of this patient, including pre-visit review of records, face to face time, post-visit ordering of testing and documentation.    Lavonda Jumbo, MD

## 2020-12-09 NOTE — Patient Instructions (Signed)
Start taking claritin or zyrtec, in addition to your singulair. Continue to stay well hydrated. Use mucinex DM or robitussin DM if your cough is getting worse, or if your mucus is thick.  If you continue to have urinary symptoms, we can then decide about either checking a urine specimen, vs treating presumptively. Part of your current symptoms is frequency, likely related to the quantity of water you're drinking. If you develop fever, flank pain (side pain at your pack), if you develop cloudy urine, odor to the urine or blood in the urine, please let us know.  COVID booster is recommended.  Once you are feeling better, schedule a nurse visit for the booster (assuming that this isn't COVID right now; if this is COVID, you can hold off longer for the booster).  Blood pressure is high today.  Periodically monitor. Continue your blood pressure medication.

## 2020-12-11 LAB — NOVEL CORONAVIRUS, NAA: SARS-CoV-2, NAA: NOT DETECTED

## 2020-12-11 LAB — SARS-COV-2, NAA 2 DAY TAT

## 2020-12-23 MED FILL — MONTELUKAST SOD 10 MG TAB: 10 | 90 days supply | Qty: 90 | Fill #0

## 2021-01-12 ENCOUNTER — Other Ambulatory Visit: Payer: Self-pay | Admitting: Family Medicine

## 2021-01-12 DIAGNOSIS — I10 Essential (primary) hypertension: Secondary | ICD-10-CM

## 2021-01-12 MED FILL — OLMESARTAN-HCTZ 40-25 MG TA: 40-25 | 90 days supply | Qty: 90 | Fill #0

## 2021-03-03 ENCOUNTER — Other Ambulatory Visit (HOSPITAL_BASED_OUTPATIENT_CLINIC_OR_DEPARTMENT_OTHER): Payer: Self-pay

## 2021-03-25 ENCOUNTER — Other Ambulatory Visit (HOSPITAL_COMMUNITY): Payer: Self-pay

## 2021-03-25 MED FILL — Montelukast Sodium Tab 10 MG (Base Equiv): ORAL | 90 days supply | Qty: 90 | Fill #0 | Status: AC

## 2021-04-01 ENCOUNTER — Other Ambulatory Visit (HOSPITAL_COMMUNITY): Payer: Self-pay

## 2021-04-11 ENCOUNTER — Other Ambulatory Visit (HOSPITAL_COMMUNITY): Payer: Self-pay

## 2021-04-11 ENCOUNTER — Other Ambulatory Visit: Payer: Self-pay | Admitting: Family Medicine

## 2021-04-11 DIAGNOSIS — I10 Essential (primary) hypertension: Secondary | ICD-10-CM

## 2021-04-11 MED ORDER — OLMESARTAN MEDOXOMIL-HCTZ 40-25 MG PO TABS
1.0000 | ORAL_TABLET | Freq: Every day | ORAL | 0 refills | Status: DC
  Filled 2021-04-11: qty 90, 90d supply, fill #0

## 2021-04-19 NOTE — Progress Notes (Signed)
Chief Complaint  Patient presents with  . Hypertension    Nonfasting med check. Has some swelling at her and says an area that "sinks" as well.    Patient presents for 6 month follow-up.  She notes some swelling at both wrists, L>R.  She noticed this after being outside working in the garden. Swelling is going down on the right, but not yet on the left, notices an area that dimples.  Denies any pain in the wrist or hand. Her fingers got "stuck" together--index and middle fingers, while straight, needed to run under water to separate them, and massage them.  It was painful that day, none now. Described it has a stiffness.  Hypertension:She continues onAmlodipine(7.48m daily)andolmesartan-HCT. She denies any side effects.She denies headaches, dizziness, chest pain, palpitations, muscle cramps (just the fingers the other day). Her BP'sare runningbetween 118-146/72-87, frequently 125-135/75-85. BP's at home today 139/84 and 144/81 (reports her allergies bothering her today).  Tries tolimit salt in her diet.(limits/avoidslunchmeat, sausage, french fries (makes herself in air fryer, no salt), chips, bacon.No canned foods). She is exercising daily--She reports she has a machine that "shakes her body", but also can use her arms. She is on her exercise bike 30 minutes daily, plus doing yardwork.  Asthma/allergies: She takes Singulair daily.She uses albuterol prn,usuallyabout once everyfewmonths,during allergy season. Allergies have been controlled with the singulair, not needing antihistamines or Flonase. Last used albuterol in December.  She has some itchy eyes today. Last spirometry wasnormal in10/2019.  Obesity:She is stillair-frying her food.She getsheadachesfrom aspartame.She is tolerating Splenda in her coffee, also tolerates Steevia. She eats small meals/snacks.  Eats a full dinner (example--steak, potato, brocolli) Not skipping meals.  Eats boiled eggs, drinks  occasional protein shake. Occasional salad. Not much vegetables, maybe 2x/week, just when she cooks. Eating a lot of fruits, making smoothies. . Pre-diabetes: In 09/2019 she was noted to have elevated fasting glucose of 107. A1c was 5.7 in 03/2020, down to 5.6 on last check. Doesn't eat much bread, occasional baked potato, avoids sweets. She gave up drinking soda, but has some juices. (unable to eat apples due dental issues, started having some apple juice).  Lab Results  Component Value Date   HGBA1C 5.6 10/21/2020   Thyroid nodules--s/p ultrasound and biopsy in 2015. Denies changes, symptoms.This was last evaluated with ultrasound in July 2019, no f/u needed (since the R thyroid nodule that was biopsied was benign). She denies any change to her thyroid, no dysphagia.  Lump on L neck hasn't changed.  Hasn't finished her dental work, still enlarged, not tender. Got larger after her COVID shot. She checks it daily and denies changes.   PMH, PSH, SH reviewed  Outpatient Encounter Medications as of 04/20/2021  Medication Sig  . amLODipine (NORVASC) 5 MG tablet TAKE 1 AND 1/2 TABLETS BY MOUTH DAILY  . montelukast (SINGULAIR) 10 MG tablet TAKE 1 TABLET BY MOUTH AT BEDTIME  . Multiple Vitamins-Minerals (CENTRUM SILVER PO) Take 1 tablet by mouth daily.  .Marland Kitchenolmesartan-hydrochlorothiazide (BENICAR HCT) 40-25 MG tablet TAKE 1 TABLET BY MOUTH ONCE A DAY  . [DISCONTINUED] aspirin 81 MG tablet Take 81 mg by mouth daily.  .Marland Kitchenalbuterol (VENTOLIN HFA) 108 (90 Base) MCG/ACT inhaler INHALE 2 PUFFS BY MOUTH INTO THE LUNGS EVERY 6 HOURS AS NEEDED FOR WHEEZING OR SHORTNESS OF BREATH. (Patient not taking: Reported on 04/20/2021)  . fluticasone (FLONASE) 50 MCG/ACT nasal spray INSTILL 2 SPRAYS INTO BOTH NOSTRILS DAILY (Patient not taking: No sig reported)  . [DISCONTINUED] ibuprofen (ADVIL,MOTRIN)  800 MG tablet Take 1 tablet (800 mg total) by mouth 3 (three) times daily. (Patient not taking: No sig reported)   No  facility-administered encounter medications on file as of 04/20/2021.   Allergies  Allergen Reactions  . Codeine Other (See Comments)    headache  . Penicillins Other (See Comments)    headache  . Sinus & Allergy [Pseudoephedrine] Other (See Comments)    Sinus medications do not work for her, they dry her up so badly that they give her asthma attacks.    ROS: no fever, chills, URI symptoms, chest pain, shortness of breath, edema. Denies GI or GU complaints. No bleeding, bruising, rash. Moods are good. Swelling in wrists per HPI. Denies change to neck mass, patient is unconcerned. +weight gain noted today   PHYSICAL EXAM:  BP 138/80   Pulse 88   Ht 5' 0.5" (1.537 m)   Wt 224 lb 6.4 oz (101.8 kg)   BMI 43.10 kg/m   Wt Readings from Last 3 Encounters:  04/20/21 224 lb 6.4 oz (101.8 kg)  12/09/20 218 lb (98.9 kg)  10/21/20 218 lb (98.9 kg)   Pleasant, well-appearing, obese female in good spirits. HEENT: conjunctiva and sclera are clear, EOMI. Wearing mask Neck: no lymphadenopathy. Thyroid mildly enlarged, R>L, no dominant mass.  Nontender, mobile submandibularmassis present on the left; now measuring 2 x 3-3.5 cm (was 2 x 2.5cm at last visit, and smaller the visit prior). Heart: regular rate and rhythm Lungs: clear bilaterally Back: no spinal or CVA tenderness Abdomen: obese, soft, nontender Extremities: no pitting edema.  She has focal area of soft tissue swelling at posterior distal forearm on the left, 4cm x 2-2.5. Nontender. 2+ pulses Psych: normal mood, affect, hygiene and grooming Neuro: alert and oriented, normal strength, gait  Lab Results  Component Value Date   HGBA1C 5.6 04/20/2021    ASSESSMENT/PLAN:  Essential hypertension, benign - Patient was pleased with her numbers--reviewed goals, many were above goal. Encouraged weight loss, cont current meds, exercise, low Na diet - Plan: amLODipine (NORVASC) 5 MG tablet  Impaired fasting glucose - counseled re:  diet, weight loss, exercise.  Encouraged her to eat more vegetables, cont to limit carbs, juices/sugar - Plan: HgB A1c  Allergic rhinitis, unspecified seasonality, unspecified trigger - controlled on current regimen.  Discussed OTC eye drops she can use if needed  Uncomplicated asthma, unspecified asthma severity, unspecified whether persistent - controlled with montelukast  Multiple thyroid nodules - stable, no further eval needed. Exam unchanged  Mass of left side of neck - This has continued to enlarge.  Insistent she "knows her body", denies changes, despite measurements increasing in size. CT recommended, declines.  CT of neck recommended, declined.  She will let us know if she changes her mind, if she feels like there is any change. Briefly discussed Ddx and reasons that imaging (and poss bx) recommended.  COVID booster recommended, declined.  Significant counseling today--in depth discussion of diet, exercise, recommendation for COVID booster (risks/SE and reasons to get), BP goals, low Na diet, and L neck mass.  Has CPE scheduled for 10/2021, wants labs prior c-met, CBC, lipid, A1c, TSH, vit D   I spent over 45 minutes dedicated to the care of this patient, including pre-visit review of records, face to face time, post-visit ordering of testing and documentation.

## 2021-04-20 ENCOUNTER — Other Ambulatory Visit (HOSPITAL_COMMUNITY): Payer: Self-pay

## 2021-04-20 ENCOUNTER — Encounter: Payer: Self-pay | Admitting: Family Medicine

## 2021-04-20 ENCOUNTER — Ambulatory Visit: Payer: 59 | Admitting: Family Medicine

## 2021-04-20 VITALS — BP 138/80 | HR 88 | Ht 60.5 in | Wt 224.4 lb

## 2021-04-20 DIAGNOSIS — R7301 Impaired fasting glucose: Secondary | ICD-10-CM

## 2021-04-20 DIAGNOSIS — J309 Allergic rhinitis, unspecified: Secondary | ICD-10-CM | POA: Diagnosis not present

## 2021-04-20 DIAGNOSIS — Z5181 Encounter for therapeutic drug level monitoring: Secondary | ICD-10-CM | POA: Diagnosis not present

## 2021-04-20 DIAGNOSIS — R221 Localized swelling, mass and lump, neck: Secondary | ICD-10-CM | POA: Diagnosis not present

## 2021-04-20 DIAGNOSIS — I10 Essential (primary) hypertension: Secondary | ICD-10-CM | POA: Diagnosis not present

## 2021-04-20 DIAGNOSIS — J45909 Unspecified asthma, uncomplicated: Secondary | ICD-10-CM | POA: Diagnosis not present

## 2021-04-20 DIAGNOSIS — E042 Nontoxic multinodular goiter: Secondary | ICD-10-CM

## 2021-04-20 LAB — POCT GLYCOSYLATED HEMOGLOBIN (HGB A1C): Hemoglobin A1C: 5.6 % (ref 4.0–5.6)

## 2021-04-20 MED ORDER — AMLODIPINE BESYLATE 5 MG PO TABS
ORAL_TABLET | Freq: Every day | ORAL | 1 refills | Status: DC
  Filled 2021-04-20 – 2021-05-30 (×2): qty 135, 90d supply, fill #0
  Filled 2021-09-28: qty 135, 90d supply, fill #1

## 2021-04-20 NOTE — Patient Instructions (Addendum)
Blood pressure is overall okay (goal is under 130/80, you average about this, though often are a little higher). Continue to limit your sodium in your diet. Continue to exercise daily and work on weight loss. Continue your current medications. Let us know if they start running more consistently 135/85 or higher, as I would increase the amlodipine dose if that were to be the case.  Try and eat more vegetables and decrease the fruit (there is a lot of sugar in fruit, especially when using large portions such as in making smoothies).  Try unsweetened applesauce instead of apple juice. Continue to drink lots of water.  Please consider getting the COVID booster (either Moderna or [employer], your choice).  I think the lump on the left neck has gotten larger and would like to take a look at it with CT scan.  Let me know if/when you're willing to do this.  The swelling in your wrist/forearm is either something muscular that will resolve, versus the start of a small lipoma (fatty benign tumor).  I'll take another look at this in November. Return soon if significant changes.

## 2021-05-30 ENCOUNTER — Other Ambulatory Visit (HOSPITAL_COMMUNITY): Payer: Self-pay

## 2021-06-28 ENCOUNTER — Other Ambulatory Visit (HOSPITAL_COMMUNITY): Payer: Self-pay

## 2021-06-28 MED FILL — Montelukast Sodium Tab 10 MG (Base Equiv): ORAL | 90 days supply | Qty: 90 | Fill #1 | Status: AC

## 2021-07-11 ENCOUNTER — Other Ambulatory Visit: Payer: Self-pay | Admitting: Family Medicine

## 2021-07-11 ENCOUNTER — Other Ambulatory Visit (HOSPITAL_COMMUNITY): Payer: Self-pay

## 2021-07-11 DIAGNOSIS — I10 Essential (primary) hypertension: Secondary | ICD-10-CM

## 2021-07-11 MED ORDER — OLMESARTAN MEDOXOMIL-HCTZ 40-25 MG PO TABS
1.0000 | ORAL_TABLET | Freq: Every day | ORAL | 0 refills | Status: DC
  Filled 2021-07-11: qty 90, 90d supply, fill #0

## 2021-08-08 ENCOUNTER — Encounter: Payer: Self-pay | Admitting: *Deleted

## 2021-08-08 DIAGNOSIS — R921 Mammographic calcification found on diagnostic imaging of breast: Secondary | ICD-10-CM | POA: Diagnosis not present

## 2021-08-08 LAB — HM MAMMOGRAPHY

## 2021-09-13 ENCOUNTER — Other Ambulatory Visit (HOSPITAL_COMMUNITY): Payer: Self-pay

## 2021-09-13 MED ORDER — AZITHROMYCIN 250 MG PO TABS
ORAL_TABLET | ORAL | 0 refills | Status: DC
  Filled 2021-09-13: qty 6, 5d supply, fill #0

## 2021-09-13 MED ORDER — IBUPROFEN 800 MG PO TABS
800.0000 mg | ORAL_TABLET | Freq: Three times a day (TID) | ORAL | 0 refills | Status: DC | PRN
  Filled 2021-09-13: qty 24, 8d supply, fill #0

## 2021-09-14 ENCOUNTER — Other Ambulatory Visit (HOSPITAL_COMMUNITY): Payer: Self-pay

## 2021-09-14 MED ORDER — DEXAMETHASONE 4 MG PO TABS
4.0000 mg | ORAL_TABLET | ORAL | 0 refills | Status: DC
  Filled 2021-09-14: qty 1, 1d supply, fill #0

## 2021-09-14 MED ORDER — IBUPROFEN 800 MG PO TABS
800.0000 mg | ORAL_TABLET | Freq: Three times a day (TID) | ORAL | 0 refills | Status: DC | PRN
  Filled 2021-09-14: qty 24, 8d supply, fill #0

## 2021-09-14 MED ORDER — ACETAMINOPHEN 500 MG PO TABS
500.0000 mg | ORAL_TABLET | ORAL | 0 refills | Status: DC | PRN
  Filled 2021-09-14: qty 60, 10d supply, fill #0

## 2021-09-16 ENCOUNTER — Other Ambulatory Visit (HOSPITAL_COMMUNITY): Payer: Self-pay

## 2021-09-19 ENCOUNTER — Other Ambulatory Visit (HOSPITAL_COMMUNITY): Payer: Self-pay

## 2021-09-28 ENCOUNTER — Other Ambulatory Visit (HOSPITAL_COMMUNITY): Payer: Self-pay

## 2021-09-28 MED FILL — Montelukast Sodium Tab 10 MG (Base Equiv): ORAL | 90 days supply | Qty: 90 | Fill #2 | Status: AC

## 2021-09-28 MED FILL — Albuterol Sulfate Inhal Aero 108 MCG/ACT (90MCG Base Equiv): RESPIRATORY_TRACT | 25 days supply | Qty: 18 | Fill #0 | Status: AC

## 2021-09-28 MED FILL — Fluticasone Propionate Nasal Susp 50 MCG/ACT: NASAL | 30 days supply | Qty: 16 | Fill #0 | Status: AC

## 2021-10-10 ENCOUNTER — Other Ambulatory Visit (HOSPITAL_COMMUNITY): Payer: Self-pay

## 2021-10-10 ENCOUNTER — Other Ambulatory Visit: Payer: Self-pay | Admitting: Family Medicine

## 2021-10-10 DIAGNOSIS — I10 Essential (primary) hypertension: Secondary | ICD-10-CM

## 2021-10-10 MED ORDER — OLMESARTAN MEDOXOMIL-HCTZ 40-25 MG PO TABS
1.0000 | ORAL_TABLET | Freq: Every day | ORAL | 0 refills | Status: DC
  Filled 2021-10-10: qty 90, 90d supply, fill #0

## 2021-11-09 ENCOUNTER — Encounter: Payer: 59 | Admitting: Family Medicine

## 2021-12-27 ENCOUNTER — Telehealth: Payer: Self-pay

## 2021-12-27 DIAGNOSIS — I10 Essential (primary) hypertension: Secondary | ICD-10-CM

## 2021-12-27 DIAGNOSIS — J45909 Unspecified asthma, uncomplicated: Secondary | ICD-10-CM

## 2021-12-27 NOTE — Telephone Encounter (Signed)
Pt. Called to reschedule her CPE she wanted a Wednesday and that was the first Wednesday I could find was 04/12/22. She stats she is going to need some refills on her medicine before then. If you could call her tomorrow. I told her you are off on Tuesday and you would not be able to call her until tomorrow.

## 2021-12-28 ENCOUNTER — Other Ambulatory Visit (HOSPITAL_COMMUNITY): Payer: Self-pay

## 2021-12-28 MED ORDER — MONTELUKAST SODIUM 10 MG PO TABS
ORAL_TABLET | Freq: Every day | ORAL | 0 refills | Status: DC
  Filled 2021-12-28: qty 90, 90d supply, fill #0

## 2021-12-28 MED ORDER — OLMESARTAN MEDOXOMIL-HCTZ 40-25 MG PO TABS
1.0000 | ORAL_TABLET | Freq: Every day | ORAL | 0 refills | Status: DC
  Filled 2021-12-28: qty 90, 90d supply, fill #0

## 2021-12-28 MED ORDER — AMLODIPINE BESYLATE 5 MG PO TABS
ORAL_TABLET | Freq: Every day | ORAL | 0 refills | Status: DC
  Filled 2021-12-28: qty 135, 90d supply, fill #0

## 2021-12-28 NOTE — Telephone Encounter (Signed)
Patient is now scheduled for 01/23/22 and does need her meds sent in, requested #[id number].

## 2022-01-13 ENCOUNTER — Encounter (HOSPITAL_COMMUNITY): Payer: Self-pay | Admitting: Emergency Medicine

## 2022-01-13 ENCOUNTER — Other Ambulatory Visit: Payer: Self-pay

## 2022-01-13 ENCOUNTER — Emergency Department (HOSPITAL_COMMUNITY)
Admission: EM | Admit: 2022-01-13 | Discharge: 2022-01-13 | Disposition: A | Discharge status: Home / Self Care | Payer: 59 | Attending: Emergency Medicine | Admitting: Emergency Medicine

## 2022-01-13 ENCOUNTER — Emergency Department (HOSPITAL_COMMUNITY): Payer: 59

## 2022-01-13 DIAGNOSIS — I10 Essential (primary) hypertension: Secondary | ICD-10-CM | POA: Diagnosis not present

## 2022-01-13 DIAGNOSIS — G4489 Other headache syndrome: Secondary | ICD-10-CM | POA: Insufficient documentation

## 2022-01-13 DIAGNOSIS — Z79899 Other long term (current) drug therapy: Secondary | ICD-10-CM | POA: Diagnosis not present

## 2022-01-13 DIAGNOSIS — R079 Chest pain, unspecified: Secondary | ICD-10-CM | POA: Diagnosis not present

## 2022-01-13 LAB — CBC
HCT: 40.8 % (ref 36.0–46.0)
Hemoglobin: 13.3 g/dL (ref 12.0–15.0)
MCH: 25.3 pg — ABNORMAL LOW (ref 26.0–34.0)
MCHC: 32.6 g/dL (ref 30.0–36.0)
MCV: 77.6 fL — ABNORMAL LOW (ref 80.0–100.0)
Platelets: 261 10*3/uL (ref 150–400)
RBC: 5.26 MIL/uL — ABNORMAL HIGH (ref 3.87–5.11)
RDW: 14.9 % (ref 11.5–15.5)
WBC: 9.9 10*3/uL (ref 4.0–10.5)
nRBC: 0 % (ref 0.0–0.2)

## 2022-01-13 LAB — BASIC METABOLIC PANEL
Anion gap: 11 (ref 5–15)
BUN: 15 mg/dL (ref 8–23)
CO2: 26 mmol/L (ref 22–32)
Calcium: 10 mg/dL (ref 8.9–10.3)
Chloride: 101 mmol/L (ref 98–111)
Creatinine, Ser: 0.88 mg/dL (ref 0.44–1.00)
GFR, Estimated: >60 mL/min (ref >60)
Glucose, Bld: 128 mg/dL — ABNORMAL HIGH (ref 70–99)
Potassium: 3.4 mmol/L — ABNORMAL LOW (ref 3.5–5.1)
Sodium: 138 mmol/L (ref 135–145)

## 2022-01-13 LAB — TROPONIN I (HIGH SENSITIVITY): Troponin I (High Sensitivity): 4 ng/L (ref <18)

## 2022-01-13 MED ORDER — ACETAMINOPHEN 325 MG PO TABS
650.0000 mg | ORAL_TABLET | Freq: Once | ORAL | Status: AC
  Administered 2022-01-13: 650 mg via ORAL
  Filled 2022-01-13: qty 2

## 2022-01-13 NOTE — ED Triage Notes (Signed)
Patient reports elevated BP=188/98 this evening , denies SOB or headache , history of hypertension .

## 2022-01-13 NOTE — ED Provider Notes (Signed)
[facility] EMERGENCY DEPARTMENT Provider Note   CSN: [id number] Arrival date & time:[facility] 01/13/22  0049     History  Chief Complaint  Patient presents with   Hypertension    Natasha Bauer is a 66 y.o. female.  The history is provided by the patient and the spouse.  Hypertension This is a recurrent problem. The problem occurs constantly. The problem has been gradually improving. Pertinent negatives include no abdominal pain, no headaches and no shortness of breath. Nothing aggravates the symptoms. Nothing relieves the symptoms.  Patient history of hypertension presents with elevated blood pressure and mild headache.  She reports while she was at work yesterday she began having a dull headache.  No focal weakness.  She reports she had mild chest tightness that felt like gas and immediately relieved after belching.  She is now back to baseline.  She reports after getting dentures placed a month ago she has had difficulty eating.  She reports her blood pressure has been lower recently so she stopped taking 1.5 tablets of Norvasc and only took 1 tablet.  She also reports compliance with Benicar      Home Medications Prior to Admission medications   Medication Sig Start Date End Date Taking? Authorizing Provider  acetaminophen (TYLENOL) 500 MG tablet Take 1 tablet (500 mg total) by mouth every 4 (four) hours as needed. 09/14/21     amLODipine (NORVASC) 5 MG tablet TAKE 1 AND 1/2 TABLETS BY MOUTH DAILY 12/28/21 12/28/22  Joselyn ArrowKnapp, Eve, MD  fluticasone [facility](FLONASE) 50 MCG/ACT nasal spray INSTILL 2 SPRAYS INTO BOTH NOSTRILS DAILY Patient not taking: No sig reported 10/21/20 10/29/21  Joselyn ArrowKnapp, Eve, MD  montelukast (SINGULAIR) 10 MG tablet TAKE 1 TABLET BY MOUTH AT BEDTIME 12/28/21 12/28/22  Joselyn ArrowKnapp, Eve, MD  Multiple Vitamins-Minerals (CENTRUM SILVER PO) Take 1 tablet by mouth daily.    [provider]  olmesartan-hydrochlorothiazide (BENICAR HCT) 40-25 MG tablet TAKE 1 TABLET  BY MOUTH ONCE A DAY 12/28/21 03/28/22  Joselyn ArrowKnapp, Eve, MD      Allergies    Codeine, Penicillins, and Sinus & allergy [pseudoephedrine]    Review of Systems   Review of Systems  Constitutional:  Negative for fever.  Respiratory:  Negative for shortness of breath.   Cardiovascular:        Chest tightness  Gastrointestinal:  Negative for abdominal pain.  Neurological:  Negative for speech difficulty, weakness, numbness and headaches.  All other systems reviewed and are negative.  Physical Exam Updated Vital Signs BP 120/73 (BP Location: Right Arm)    Pulse 93    Temp 98.3 F (36.8 C) (Oral)    Resp 17    Ht 1.549 m (5\' 1" )    Wt 105 kg    SpO2 99%    BMI 43.74 kg/m  Physical Exam CONSTITUTIONAL: Well developed/well nourished HEAD: Normocephalic/atraumatic EYES: EOMI/PERRL, no ptosis ENMT: Mucous membranes moist NECK: supple no meningeal signs CV: S1/S2 noted, no murmurs/rubs/gallops noted LUNGS: Lungs are clear to auscultation bilaterally, no apparent distress ABDOMEN: soft, nontender, no rebound or guarding GU:no cva tenderness NEURO:Awake/alert, face symmetric, no arm or leg drift is noted Equal 5/5 strength with shoulder abduction, elbow flex/extension, wrist flex/extension in upper extremities and equal hand grips bilaterally Equal 5/5 strength with hip flexion,knee flex/extension, foot dorsi/plantar flexion Cranial nerves 3/4/5/6/06/18/09/11/12 tested and intact No past pointing Sensation to light touch intact in all extremities EXTREMITIES: pulses normal, full ROM SKIN: warm, color normal PSYCH: no abnormalities of mood noted  ED Results / Procedures / Treatments   Labs (all labs ordered are listed, but only abnormal results are displayed) Labs Reviewed  BASIC METABOLIC PANEL - Abnormal; Notable for the following components:      Result Value   Potassium 3.4 (*)    Glucose, Bld 128 (*)    All other components within normal limits  CBC - Abnormal; Notable for the  following components:   RBC 5.26 (*)    MCV 77.6 (*)    MCH 25.3 (*)    All other components within normal limits  TROPONIN I (HIGH SENSITIVITY)    EKG EKG Interpretation  Date/Time:  Friday January 13 2022 01:40:53 EST Ventricular Rate:  106 PR Interval:  154 QRS Duration: 92 QT Interval:  340 QTC Calculation: 451 R Axis:   28 Text Interpretation: Sinus tachycardia Nonspecific ST and T wave abnormality Abnormal ECG Confirmed by Zadie Rhine ([id number]) on 01/13/2022 4:26:37 AM  Radiology DG Chest Port 1 View  Result Date: 01/13/2022 CLINICAL DATA:  Chest pain EXAM: PORTABLE CHEST 1 VIEW COMPARISON:  None. FINDINGS: The heart size and mediastinal contours are within normal limits. Both lungs are clear. The visualized skeletal structures are unremarkable. IMPRESSION: No active disease. Electronically Signed   By: Helyn Numbers M.D.   On: 01/13/2022 02:06    Procedures Procedures    Medications Ordered in ED Medications  acetaminophen (TYLENOL) tablet 650 mg (650 mg Oral Given 01/13/22 0138)    ED Course/ Medical Decision Making/ A&P                           Medical Decision Making  This patient presents to the ED for concern of headache and hypertension, this involves an extensive number of treatment options, and is a complaint that carries with it a high risk of complications and morbidity.  The differential diagnosis includes stroke, intracranial hemorrhage, hypertensive emergency  Comorbidities that complicate the patient evaluation: Patients presentation is complicated by their history of hypertension    Additional history obtained: Additional history obtained from spouse   Lab Tests: I Ordered, and personally interpreted labs.  The pertinent results include: Labs are unremarkable  Imaging Studies ordered: I ordered imaging studies including X-ray chest I independently visualized and interpreted imaging which showed no acute findings I agree with the  radiologist interpretation  Cardiac Monitoring: The patient was maintained on a cardiac monitor.  I personally viewed and interpreted the cardiac monitor which showed an underlying rhythm of:  sinus rhythm  Medicines ordered and prescription drug management: I ordered medication including Tylenol for headache Reevaluation of the patient after these medicines showed that the patient    improved  Test Considered: Patient is low risk and considered CT head, the patient's symptoms have resolved and no signs of stroke are identified, therefore do not feel that CT head is indicated.   Reevaluation: After the interventions noted above, I reevaluated the patient and found that they have :improved  Complexity of problems addressed: Patients presentation is most consistent with  acute, uncomplicated illness      Disposition: After consideration of the diagnostic results and the patients response to treatment,  I feel that the patent would benefit from discharge .            Final Clinical Impression(s) / ED Diagnoses Final diagnoses:  Primary hypertension  Other headache syndrome    Rx / DC Orders ED Discharge Orders     None  Zadie Rhine, MD 01/13/22 [phone]

## 2022-01-13 NOTE — ED Notes (Signed)
Pt discharged and ambulated out of the ED without difficulty. 

## 2022-01-13 NOTE — ED Provider Triage Note (Signed)
Emergency Medicine Provider Triage Evaluation Note  Natasha Bauer , a 66 y.o. female  was evaluated in triage.  Pt complains of HTH. States she had a headache earlier today. She took her BP and it was elevated. She further reports she had some chest tightness that was relieved after she belched.  Review of Systems  Positive: Headache, chest tightness Negative: Vision changes  Physical Exam  BP (!) 161/102 (BP Location: Right Arm)    Pulse (!) 110    Temp 98.2 F (36.8 C) (Oral)    Resp 18    Ht 5\' 1"  (1.549 m)    Wt 105 kg    SpO2 99%    BMI 43.74 kg/m  Gen:   Awake, no distress   Resp:  Normal effort  MSK:   Moves extremities without difficulty  Other:  Clear speech, CN II-XII intact, 5/5 strength to the bue/ble  Medical Decision Making  Medically screening exam initiated at 1:24 AM.  Appropriate orders placed.  Natasha Bauer was informed that the remainder of the evaluation will be completed by another provider, this initial triage assessment does not replace that evaluation, and the importance of remaining in the ED until their evaluation is complete.     Natasha Bauer, Natasha Whittier S, PA-C 01/13/22 0127

## 2022-01-17 NOTE — Patient Instructions (Addendum)
°  HEALTH MAINTENANCE RECOMMENDATIONS:  It is recommended that you get at least 30 minutes of aerobic exercise at least 5 days/week (for weight loss, you may need as much as 60-90 minutes). This can be any activity that gets your heart rate up. This can be divided in 10-15 minute intervals if needed, but try and build up your endurance at least once a week.  Weight bearing exercise is also recommended twice weekly.  Eat a healthy diet with lots of vegetables, fruits and fiber.  "Colorful" foods have a lot of vitamins (ie green vegetables, tomatoes, red peppers, etc).  Limit sweet tea, regular sodas and alcoholic beverages, all of which has a lot of calories and sugar.  Up to 1 alcoholic drink daily may be beneficial for women (unless trying to lose weight, watch sugars).  Drink a lot of water.  Calcium recommendations are 1200-1500 mg daily (1500 mg for postmenopausal women or women without ovaries), and vitamin D 1000 IU daily.  This should be obtained from diet and/or supplements (vitamins), and calcium should not be taken all at once, but in divided doses.  Monthly self breast exams and yearly mammograms for women over the age of 64 is recommended.  Sunscreen of at least SPF 30 should be used on all sun-exposed parts of the skin when outside between the hours of 10 am and 4 pm (not just when at beach or pool, but even with exercise, golf, tennis, and yard work!)  Use a sunscreen that says "broad spectrum" so it covers both UVA and UVB rays, and make sure to reapply every 1-2 hours.  Remember to change the batteries in your smoke detectors when changing your clock times in the spring and fall. Carbon monoxide detectors are recommended for your home.  Use your seat belt every time you are in a car, and please drive safely and not be distracted with cell phones and texting while driving.  During allergy season, if your allergies aren't adequately controlled with the singulair, you may take claritin or  allegra or zyrtec once daily.    I recommend getting the new shingles vaccine (Shingrix). You may want to check with your insurance to verify what your out of pocket cost may be (usually covered as preventative, but better to verify to avoid any surprises, as this vaccine is expensive), and then schedule a nurse visit at our office when convenient (based on the possible side effects as discussed).   This is a series of 2 injections, spaced 2 months apart.  It doesn't have to be exactly 2 months apart (but can't be sooner), if that isn't feasible for your schedule, but try and get them close to 2 months (and definitely within 6 months of each other, or else the efficacy of the vaccine drops off). This should be separated from other vaccines by at least 2 weeks.  I highly encourage you to get the bivalent COVID booster (at our office or from a pharmacy).  You are due for your colonoscopy with Dr. Elnoria Howard in 08/2022.
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
## 2022-01-18 ENCOUNTER — Encounter: Payer: Self-pay | Admitting: Family Medicine

## 2022-01-18 ENCOUNTER — Other Ambulatory Visit: Payer: Self-pay

## 2022-01-18 ENCOUNTER — Ambulatory Visit (INDEPENDENT_AMBULATORY_CARE_PROVIDER_SITE_OTHER): Payer: 59 | Admitting: Family Medicine

## 2022-01-18 VITALS — BP 126/82 | HR 88 | Ht 60.0 in | Wt 214.0 lb

## 2022-01-18 DIAGNOSIS — J45909 Unspecified asthma, uncomplicated: Secondary | ICD-10-CM

## 2022-01-18 DIAGNOSIS — E042 Nontoxic multinodular goiter: Secondary | ICD-10-CM

## 2022-01-18 DIAGNOSIS — Z23 Encounter for immunization: Secondary | ICD-10-CM

## 2022-01-18 DIAGNOSIS — Z5181 Encounter for therapeutic drug level monitoring: Secondary | ICD-10-CM | POA: Diagnosis not present

## 2022-01-18 DIAGNOSIS — Z0000 Encounter for general adult medical examination without abnormal findings: Secondary | ICD-10-CM | POA: Diagnosis not present

## 2022-01-18 DIAGNOSIS — R7301 Impaired fasting glucose: Secondary | ICD-10-CM | POA: Diagnosis not present

## 2022-01-18 DIAGNOSIS — I10 Essential (primary) hypertension: Secondary | ICD-10-CM | POA: Diagnosis not present

## 2022-01-18 DIAGNOSIS — J309 Allergic rhinitis, unspecified: Secondary | ICD-10-CM | POA: Diagnosis not present

## 2022-01-18 DIAGNOSIS — R221 Localized swelling, mass and lump, neck: Secondary | ICD-10-CM

## 2022-01-18 DIAGNOSIS — R635 Abnormal weight gain: Secondary | ICD-10-CM

## 2022-01-19 LAB — CBC WITH DIFFERENTIAL/PLATELET
Basophils Absolute: 0 10*3/uL (ref 0.0–0.2)
Basos: 0 %
EOS (ABSOLUTE): 0.1 10*3/uL (ref 0.0–0.4)
Eos: 1 %
Hematocrit: 43 % (ref 34.0–46.6)
Hemoglobin: 13.9 g/dL (ref 11.1–15.9)
Immature Grans (Abs): 0 10*3/uL (ref 0.0–0.1)
Immature Granulocytes: 0 %
Lymphocytes Absolute: 1.7 10*3/uL (ref 0.7–3.1)
Lymphs: 23 %
MCH: 25 pg — ABNORMAL LOW (ref 26.6–33.0)
MCHC: 32.3 g/dL (ref 31.5–35.7)
MCV: 77 fL — ABNORMAL LOW (ref 79–97)
Monocytes Absolute: 0.5 10*3/uL (ref 0.1–0.9)
Monocytes: 7 %
Neutrophils Absolute: 5.3 10*3/uL (ref 1.4–7.0)
Neutrophils: 69 %
Platelets: 298 10*3/uL (ref 150–450)
RBC: 5.56 x10E6/uL — ABNORMAL HIGH (ref 3.77–5.28)
RDW: 15.1 % (ref 11.7–15.4)
WBC: 7.7 10*3/uL (ref 3.4–10.8)

## 2022-01-19 LAB — LIPID PANEL
Chol/HDL Ratio: 2 ratio (ref 0.0–4.4)
Cholesterol, Total: 224 mg/dL — ABNORMAL HIGH (ref 100–199)
HDL: 113 mg/dL (ref >39)
LDL Chol Calc (NIH): 97 mg/dL (ref 0–99)
Triglycerides: 80 mg/dL (ref 0–149)
VLDL Cholesterol Cal: 14 mg/dL (ref 5–40)

## 2022-01-19 LAB — COMPREHENSIVE METABOLIC PANEL
ALT: 20 IU/L (ref 0–32)
AST: 21 IU/L (ref 0–40)
Albumin/Globulin Ratio: 1.7 (ref 1.2–2.2)
Albumin: 4.8 g/dL (ref 3.8–4.8)
Alkaline Phosphatase: 67 IU/L (ref 44–121)
BUN/Creatinine Ratio: 16 (ref 12–28)
BUN: 14 mg/dL (ref 8–27)
Bilirubin Total: 0.3 mg/dL (ref 0.0–1.2)
CO2: 25 mmol/L (ref 20–29)
Calcium: 10 mg/dL (ref 8.7–10.3)
Chloride: 97 mmol/L (ref 96–106)
Creatinine, Ser: 0.88 mg/dL (ref 0.57–1.00)
Globulin, Total: 2.8 g/dL (ref 1.5–4.5)
Glucose: 107 mg/dL — ABNORMAL HIGH (ref 70–99)
Potassium: 4 mmol/L (ref 3.5–5.2)
Sodium: 138 mmol/L (ref 134–144)
Total Protein: 7.6 g/dL (ref 6.0–8.5)
eGFR: 74 mL/min/{1.73_m2} (ref >59)

## 2022-01-19 LAB — HEMOGLOBIN A1C
Est. average glucose Bld gHb Est-mCnc: 123 mg/dL
Hgb A1c MFr Bld: 5.9 % — ABNORMAL HIGH (ref 4.8–5.6)

## 2022-01-19 LAB — VITAMIN D 25 HYDROXY (VIT D DEFICIENCY, FRACTURES): Vit D, 25-Hydroxy: 28.8 ng/mL — ABNORMAL LOW (ref 30.0–100.0)

## 2022-01-19 LAB — TSH: TSH: 0.692 u[IU]/mL (ref 0.450–4.500)

## 2022-01-23 ENCOUNTER — Encounter: Payer: 59 | Admitting: Family Medicine

## 2022-04-04 ENCOUNTER — Other Ambulatory Visit: Payer: Self-pay | Admitting: Family Medicine

## 2022-04-04 ENCOUNTER — Other Ambulatory Visit (HOSPITAL_COMMUNITY): Payer: Self-pay

## 2022-04-04 DIAGNOSIS — J309 Allergic rhinitis, unspecified: Secondary | ICD-10-CM

## 2022-04-04 DIAGNOSIS — J45909 Unspecified asthma, uncomplicated: Secondary | ICD-10-CM

## 2022-04-04 DIAGNOSIS — I10 Essential (primary) hypertension: Secondary | ICD-10-CM

## 2022-04-04 MED ORDER — MONTELUKAST SODIUM 10 MG PO TABS
ORAL_TABLET | Freq: Every day | ORAL | 0 refills | Status: DC
  Filled 2022-04-04: qty 90, 90d supply, fill #0

## 2022-04-04 MED ORDER — ALBUTEROL SULFATE HFA 108 (90 BASE) MCG/ACT IN AERS
2.0000 | INHALATION_SPRAY | Freq: Four times a day (QID) | RESPIRATORY_TRACT | 0 refills | Status: DC | PRN
Start: 2022-04-04 — End: 2022-10-04
  Filled 2022-04-04: qty 18, 25d supply, fill #0

## 2022-04-04 MED ORDER — FLUTICASONE PROPIONATE 50 MCG/ACT NA SUSP
2.0000 | Freq: Every day | NASAL | 2 refills | Status: DC
  Filled 2022-04-04: qty 16, 30d supply, fill #0
  Filled 2022-10-04: qty 16, 30d supply, fill #1

## 2022-04-04 MED ORDER — AMLODIPINE BESYLATE 5 MG PO TABS
ORAL_TABLET | Freq: Every day | ORAL | 0 refills | Status: DC
  Filled 2022-04-04: qty 135, 90d supply, fill #0

## 2022-04-04 MED ORDER — OLMESARTAN MEDOXOMIL-HCTZ 40-25 MG PO TABS
1.0000 | ORAL_TABLET | Freq: Every day | ORAL | 0 refills | Status: DC
  Filled 2022-04-04: qty 90, 90d supply, fill #0

## 2022-04-04 NOTE — Telephone Encounter (Signed)
Is this okay to refill? 

## 2022-04-12 ENCOUNTER — Encounter: Payer: 59 | Admitting: Family Medicine

## 2022-07-04 ENCOUNTER — Other Ambulatory Visit: Payer: Self-pay | Admitting: Family Medicine

## 2022-07-04 ENCOUNTER — Other Ambulatory Visit (HOSPITAL_COMMUNITY): Payer: Self-pay

## 2022-07-04 DIAGNOSIS — I10 Essential (primary) hypertension: Secondary | ICD-10-CM

## 2022-07-04 DIAGNOSIS — J45909 Unspecified asthma, uncomplicated: Secondary | ICD-10-CM

## 2022-07-05 ENCOUNTER — Other Ambulatory Visit (HOSPITAL_COMMUNITY): Payer: Self-pay

## 2022-07-05 MED ORDER — MONTELUKAST SODIUM 10 MG PO TABS
ORAL_TABLET | Freq: Every day | ORAL | 0 refills | Status: DC
  Filled 2022-07-05: qty 90, 90d supply, fill #0

## 2022-07-05 MED ORDER — AMLODIPINE BESYLATE 5 MG PO TABS
ORAL_TABLET | Freq: Every day | ORAL | 0 refills | Status: DC
  Filled 2022-07-05: qty 135, 90d supply, fill #0

## 2022-07-05 MED ORDER — OLMESARTAN MEDOXOMIL-HCTZ 40-25 MG PO TABS
1.0000 | ORAL_TABLET | Freq: Every day | ORAL | 0 refills | Status: DC
  Filled 2022-07-05: qty 90, 90d supply, fill #0

## 2022-07-18 NOTE — Progress Notes (Unsigned)
  No chief complaint on file.  Hypertension follow-up: She reports compliance in taking 7.5mg  of amlodipine and Benicar-HCT. BP's are running  She denies headaches, dizziness, chest pain, palpitations, muscle cramps. Tries to limit salt in her diet--she limits/avoids lunchmeat, sausage, french fries (makes herself in air fryer, no salt), chips, bacon. No canned foods.   Asthma/allergies:  She takes Singulair daily.  She uses albuterol prn, usually about once every few months, during allergy season. Allergies have been controlled with the singulair, not needing antihistamines or Flonase.  Last spirometry was normal in 09/2018.   Obesity: She lost a lot of weight related to dietary changes from getting dentures.     Pre-diabetes: In 09/2019 she was noted to have elevated fasting glucose of 107. Last A1c was 5.9% in 01/2022, with fasting glucose of 107. Doesn't eat much bread, occasional baked potato, avoids sweets. She gave up drinking soda, but has some juices.  UPDATE diet  Vitamin D deficiency:  Last level was 28.8 in 01/2022. She was advised to start taking a vitamin D3 1000 IU daily, in addition to her MVI. She is currently taking   Thyroid nodules--s/p ultrasound and biopsy in 2015. Denies changes, symptoms. This was last evaluated with ultrasound in July 2019, no f/u needed (since the R thyroid nodule that was biopsied was benign, benign follicular nodule). She denies any change to her thyroid, no dysphagia.   Lump on L neck hasn't changed. She checks it daily and denies changes, nontender.   PMH, PSH, SH reviewed   ROS: no fever, chills, URI symptoms, chest pain, shortness of breath, edema. Denies GI or GU complaints. No bleeding, bruising, rash. Moods are good. Denies change to neck mass, patient is unconcerned. weight      PHYSICAL EXAM:  There were no vitals taken for this visit.  Wt Readings from Last 3 Encounters:  01/18/22 214 lb (97.1 kg)  01/13/22 231 lb 7.7 oz  (105 kg)  04/20/21 224 lb 6.4 oz (101.8 kg)   Pleasant, well-appearing, obese female in good spirits. HEENT: conjunctiva and sclera are clear, EOMI.  Neck: no lymphadenopathy. Thyroid mildly enlarged, R>L, no dominant mass.  Nontender, mobile submandibular mass is present on the left, measuring  3 x 2-2.5 Heart: regular rate and rhythm Lungs: clear bilaterally Back: no spinal or CVA tenderness Abdomen: obese, soft, nontender Extremities: no pitting edema. 2+ pulses Psych: normal mood, affect, hygiene and grooming Neuro: alert and oriented, normal strength, gait  ***MEASURE L eubmandibular mass   ASSESSMENT/PLAN:  A1c if weight gain   Amlodipine, singulair and benicar HCT were RF'd x90d the end of July, no RF's needed today.

## 2022-07-18 NOTE — Patient Instructions (Incomplete)
Yearly flu shots are recommended in the Fall (September/October). I also recommend getting the newly updated bivalent COVID booster when available in the Fall.  Restart vitamin D3 1000 IU daily.  Cut back on juices (unless you look at the sugar content--try the 5 calorie Ocean Spray cranberry juice if juices are needed).

## 2022-07-19 ENCOUNTER — Encounter: Payer: Self-pay | Admitting: Family Medicine

## 2022-07-19 ENCOUNTER — Ambulatory Visit (INDEPENDENT_AMBULATORY_CARE_PROVIDER_SITE_OTHER): Payer: 59 | Admitting: Family Medicine

## 2022-07-19 VITALS — BP 130/90 | HR 85 | Wt 210.4 lb

## 2022-07-19 DIAGNOSIS — J309 Allergic rhinitis, unspecified: Secondary | ICD-10-CM | POA: Diagnosis not present

## 2022-07-19 DIAGNOSIS — I10 Essential (primary) hypertension: Secondary | ICD-10-CM

## 2022-07-19 DIAGNOSIS — R7301 Impaired fasting glucose: Secondary | ICD-10-CM | POA: Diagnosis not present

## 2022-07-19 DIAGNOSIS — J45909 Unspecified asthma, uncomplicated: Secondary | ICD-10-CM | POA: Diagnosis not present

## 2022-07-19 DIAGNOSIS — Z6841 Body Mass Index (BMI) 40.0 and over, adult: Secondary | ICD-10-CM | POA: Diagnosis not present

## 2022-07-19 DIAGNOSIS — E66813 Obesity, class 3: Secondary | ICD-10-CM

## 2022-08-09 DIAGNOSIS — Z1231 Encounter for screening mammogram for malignant neoplasm of breast: Secondary | ICD-10-CM | POA: Diagnosis not present

## 2022-08-09 LAB — HM MAMMOGRAPHY

## 2022-08-16 ENCOUNTER — Encounter: Payer: Self-pay | Admitting: Internal Medicine

## 2022-09-19 ENCOUNTER — Encounter: Payer: Self-pay | Admitting: Internal Medicine

## 2022-10-02 ENCOUNTER — Other Ambulatory Visit: Payer: Self-pay | Admitting: Family Medicine

## 2022-10-02 ENCOUNTER — Other Ambulatory Visit (HOSPITAL_COMMUNITY): Payer: Self-pay

## 2022-10-02 DIAGNOSIS — I10 Essential (primary) hypertension: Secondary | ICD-10-CM

## 2022-10-02 DIAGNOSIS — J45909 Unspecified asthma, uncomplicated: Secondary | ICD-10-CM

## 2022-10-02 MED ORDER — MONTELUKAST SODIUM 10 MG PO TABS
10.0000 mg | ORAL_TABLET | Freq: Every day | ORAL | 0 refills | Status: DC
  Filled 2022-10-02: qty 90, 90d supply, fill #0

## 2022-10-02 MED ORDER — AMLODIPINE BESYLATE 5 MG PO TABS
7.5000 mg | ORAL_TABLET | Freq: Every day | ORAL | 0 refills | Status: DC
  Filled 2022-10-02: qty 135, 90d supply, fill #0

## 2022-10-02 MED ORDER — OLMESARTAN MEDOXOMIL-HCTZ 40-25 MG PO TABS
1.0000 | ORAL_TABLET | Freq: Every day | ORAL | 0 refills | Status: DC
  Filled 2022-10-02: qty 90, 90d supply, fill #0

## 2022-10-03 ENCOUNTER — Other Ambulatory Visit (HOSPITAL_COMMUNITY): Payer: Self-pay

## 2022-10-04 ENCOUNTER — Other Ambulatory Visit: Payer: Self-pay | Admitting: Family Medicine

## 2022-10-04 ENCOUNTER — Other Ambulatory Visit (HOSPITAL_COMMUNITY): Payer: Self-pay

## 2022-10-04 MED ORDER — ALBUTEROL SULFATE HFA 108 (90 BASE) MCG/ACT IN AERS
2.0000 | INHALATION_SPRAY | Freq: Four times a day (QID) | RESPIRATORY_TRACT | 0 refills | Status: DC | PRN
Start: 2022-10-04 — End: 2023-01-31
  Filled 2022-10-04: qty 6.7, 25d supply, fill #0

## 2022-10-04 NOTE — Telephone Encounter (Signed)
Is this okay to refill? 

## 2022-12-20 ENCOUNTER — Encounter: Payer: Self-pay | Admitting: Internal Medicine

## 2022-12-26 ENCOUNTER — Other Ambulatory Visit (HOSPITAL_COMMUNITY): Payer: Self-pay

## 2022-12-26 ENCOUNTER — Other Ambulatory Visit: Payer: Self-pay | Admitting: Family Medicine

## 2022-12-26 DIAGNOSIS — J45909 Unspecified asthma, uncomplicated: Secondary | ICD-10-CM

## 2022-12-26 DIAGNOSIS — I10 Essential (primary) hypertension: Secondary | ICD-10-CM

## 2022-12-26 MED ORDER — OLMESARTAN MEDOXOMIL-HCTZ 40-25 MG PO TABS
1.0000 | ORAL_TABLET | Freq: Every day | ORAL | 0 refills | Status: DC
  Filled 2022-12-26: qty 90, 90d supply, fill #0

## 2022-12-26 MED ORDER — AMLODIPINE BESYLATE 5 MG PO TABS
7.5000 mg | ORAL_TABLET | Freq: Every day | ORAL | 0 refills | Status: DC
  Filled 2022-12-26: qty 135, 90d supply, fill #0

## 2022-12-26 MED ORDER — MONTELUKAST SODIUM 10 MG PO TABS
10.0000 mg | ORAL_TABLET | Freq: Every day | ORAL | 0 refills | Status: DC
  Filled 2022-12-26: qty 90, 90d supply, fill #0

## 2023-01-23 NOTE — Patient Instructions (Incomplete)
  HEALTH MAINTENANCE RECOMMENDATIONS:  It is recommended that you get at least 30 minutes of aerobic exercise at least 5 days/week (for weight loss, you may need as much as 60-90 minutes). This can be any activity that gets your heart rate up. This can be divided in 10-15 minute intervals if needed, but try and build up your endurance at least once a week.  Weight bearing exercise is also recommended twice weekly.  Eat a healthy diet with lots of vegetables, fruits and fiber.  "Colorful" foods have a lot of vitamins (ie green vegetables, tomatoes, red peppers, etc).  Limit sweet tea, regular sodas and alcoholic beverages, all of which has a lot of calories and sugar.  Up to 1 alcoholic drink daily may be beneficial for women (unless trying to lose weight, watch sugars).  Drink a lot of water.  Calcium recommendations are 1200-1500 mg daily (1500 mg for postmenopausal women or women without ovaries), and vitamin D 1000 IU daily.  This should be obtained from diet and/or supplements (vitamins), and calcium should not be taken all at once, but in divided doses.  Monthly self breast exams and yearly mammograms for women over the age of 81 is recommended.  Sunscreen of at least SPF 30 should be used on all sun-exposed parts of the skin when outside between the hours of 10 am and 4 pm (not just when at beach or pool, but even with exercise, golf, tennis, and yard work!)  Use a sunscreen that says "broad spectrum" so it covers both UVA and UVB rays, and make sure to reapply every 1-2 hours.  Remember to change the batteries in your smoke detectors when changing your clock times in the spring and fall. Carbon monoxide detectors are recommended for your home.  Use your seat belt every time you are in a car, and please drive safely and not be distracted with cell phones and texting while driving.  I recommend getting the new shingles vaccine (Shingrix). This is a series of 2 injections, spaced 2 months apart.   It doesn't have to be exactly 2 months apart (but can't be sooner), if that isn't feasible for your schedule, but try and get them close to 2 months (and definitely within 6 months of each other, or else the efficacy of the vaccine drops off). This should be separated from other vaccines by at least 2 weeks. Once you go on Medicare, you will need to get this vaccine from the pharmacy, rather than from our office.  Your blood pressures vary widely--it would be nice if you can try and jot some notes about when it is high vs low, to see what might be contributing (and avoid things that make it run high, and do more of the things that keep it low, ie exercise). If it is high, be sure to rest and wait 5 minutes ,and recheck it. Goal blood pressure is <130/80. Exercise, low sodium diet and weight loss should all help keep the blood pressure low.  If your allergies flare, you can start with an antihistamine such as claritin or allegra or zyrtec, or you can start a nasal steroid spray such as Flonase (to use along with the montelukast).  Call Dr. Ulyses Amor office to reschedule your colonoscopy.
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
## 2023-01-24 ENCOUNTER — Encounter: Payer: Self-pay | Admitting: Family Medicine

## 2023-01-24 ENCOUNTER — Ambulatory Visit (INDEPENDENT_AMBULATORY_CARE_PROVIDER_SITE_OTHER): Payer: Commercial Managed Care - PPO | Admitting: Family Medicine

## 2023-01-24 VITALS — BP 148/84 | HR 80 | Ht 61.0 in | Wt 205.4 lb

## 2023-01-24 DIAGNOSIS — Z0000 Encounter for general adult medical examination without abnormal findings: Secondary | ICD-10-CM

## 2023-01-24 DIAGNOSIS — R7301 Impaired fasting glucose: Secondary | ICD-10-CM | POA: Diagnosis not present

## 2023-01-24 DIAGNOSIS — R0989 Other specified symptoms and signs involving the circulatory and respiratory systems: Secondary | ICD-10-CM | POA: Diagnosis not present

## 2023-01-24 DIAGNOSIS — R221 Localized swelling, mass and lump, neck: Secondary | ICD-10-CM

## 2023-01-24 DIAGNOSIS — Z6838 Body mass index (BMI) 38.0-38.9, adult: Secondary | ICD-10-CM

## 2023-01-24 DIAGNOSIS — J309 Allergic rhinitis, unspecified: Secondary | ICD-10-CM | POA: Diagnosis not present

## 2023-01-24 DIAGNOSIS — E559 Vitamin D deficiency, unspecified: Secondary | ICD-10-CM

## 2023-01-24 DIAGNOSIS — I10 Essential (primary) hypertension: Secondary | ICD-10-CM

## 2023-01-24 DIAGNOSIS — E042 Nontoxic multinodular goiter: Secondary | ICD-10-CM

## 2023-01-25 ENCOUNTER — Telehealth: Payer: Self-pay | Admitting: *Deleted

## 2023-01-25 LAB — CBC WITH DIFFERENTIAL/PLATELET
Basophils Absolute: 0 10*3/uL (ref 0.0–0.2)
Basos: 1 %
EOS (ABSOLUTE): 0.2 10*3/uL (ref 0.0–0.4)
Eos: 3 %
Hematocrit: 43.6 % (ref 34.0–46.6)
Hemoglobin: 14 g/dL (ref 11.1–15.9)
Immature Grans (Abs): 0 10*3/uL (ref 0.0–0.1)
Immature Granulocytes: 0 %
Lymphocytes Absolute: 2 10*3/uL (ref 0.7–3.1)
Lymphs: 38 %
MCH: 25.1 pg — ABNORMAL LOW (ref 26.6–33.0)
MCHC: 32.1 g/dL (ref 31.5–35.7)
MCV: 78 fL — ABNORMAL LOW (ref 79–97)
Monocytes Absolute: 0.4 10*3/uL (ref 0.1–0.9)
Monocytes: 8 %
Neutrophils Absolute: 2.6 10*3/uL (ref 1.4–7.0)
Neutrophils: 50 %
Platelets: 267 10*3/uL (ref 150–450)
RBC: 5.58 x10E6/uL — ABNORMAL HIGH (ref 3.77–5.28)
RDW: 14.7 % (ref 11.7–15.4)
WBC: 5.2 10*3/uL (ref 3.4–10.8)

## 2023-01-25 LAB — HEMOGLOBIN A1C
Est. average glucose Bld gHb Est-mCnc: 123 mg/dL
Hgb A1c MFr Bld: 5.9 % — ABNORMAL HIGH (ref 4.8–5.6)

## 2023-01-25 LAB — COMPREHENSIVE METABOLIC PANEL
ALT: 15 IU/L (ref 0–32)
AST: 21 IU/L (ref 0–40)
Albumin/Globulin Ratio: 1.7 (ref 1.2–2.2)
Albumin: 4.7 g/dL (ref 3.9–4.9)
Alkaline Phosphatase: 58 IU/L (ref 44–121)
BUN/Creatinine Ratio: 22 (ref 12–28)
BUN: 18 mg/dL (ref 8–27)
Bilirubin Total: 0.2 mg/dL (ref 0.0–1.2)
CO2: 25 mmol/L (ref 20–29)
Calcium: 10.4 mg/dL — ABNORMAL HIGH (ref 8.7–10.3)
Chloride: 98 mmol/L (ref 96–106)
Creatinine, Ser: 0.81 mg/dL (ref 0.57–1.00)
Globulin, Total: 2.7 g/dL (ref 1.5–4.5)
Glucose: 101 mg/dL — ABNORMAL HIGH (ref 70–99)
Potassium: 4 mmol/L (ref 3.5–5.2)
Sodium: 140 mmol/L (ref 134–144)
Total Protein: 7.4 g/dL (ref 6.0–8.5)
eGFR: 81 mL/min/{1.73_m2} (ref >59)

## 2023-01-25 LAB — LIPID PANEL
Chol/HDL Ratio: 2.1 ratio (ref 0.0–4.4)
Cholesterol, Total: 252 mg/dL — ABNORMAL HIGH (ref 100–199)
HDL: 121 mg/dL (ref >39)
LDL Chol Calc (NIH): 119 mg/dL — ABNORMAL HIGH (ref 0–99)
Triglycerides: 72 mg/dL (ref 0–149)
VLDL Cholesterol Cal: 12 mg/dL (ref 5–40)

## 2023-01-25 LAB — VITAMIN D 25 HYDROXY (VIT D DEFICIENCY, FRACTURES): Vit D, 25-Hydroxy: 32.8 ng/mL (ref 30.0–100.0)

## 2023-01-25 LAB — TSH: TSH: 0.824 u[IU]/mL (ref 0.450–4.500)

## 2023-01-25 NOTE — Telephone Encounter (Signed)
I would love that but you would have to get patient's permission, as this is an extra scan

## 2023-01-25 NOTE — Telephone Encounter (Signed)
They need an additional order placed to look at the large mass at neck. Are you ok if I order this? US soft tissue neck. (I did see where you wrote that patient refuses f/u, so I was unsure). Thanks.

## 2023-01-25 NOTE — Telephone Encounter (Signed)
Tried to call patient and would not go through, tried several times. Will try again at another time.

## 2023-01-29 ENCOUNTER — Encounter: Payer: Self-pay | Admitting: *Deleted

## 2023-01-29 NOTE — Telephone Encounter (Signed)
Still cannot get a hold of patient. Sending MyChart message as well.

## 2023-01-30 NOTE — Telephone Encounter (Signed)
Spoke with patient and she refused second scan order. Called Kathlee Nations at [facility] and let her know. They will call and get original order scheduled and they will disregard looking at mass.

## 2023-01-31 ENCOUNTER — Ambulatory Visit (HOSPITAL_COMMUNITY)
Admission: EM | Admit: 2023-01-31 | Discharge: 2023-01-31 | Disposition: A | Discharge status: Home / Self Care | Payer: Commercial Managed Care - PPO | Attending: Internal Medicine | Admitting: Internal Medicine

## 2023-01-31 DIAGNOSIS — K224 Dyskinesia of esophagus: Secondary | ICD-10-CM | POA: Diagnosis not present

## 2023-01-31 MED ORDER — LIDOCAINE VISCOUS HCL 2 % MT SOLN
OROMUCOSAL | Status: AC
  Filled 2023-01-31: qty 15

## 2023-01-31 MED ORDER — ALUM & MAG HYDROXIDE-SIMETH 200-200-20 MG/5ML PO SUSP
30.0000 mL | Freq: Once | ORAL | Status: AC
  Administered 2023-01-31: 30 mL via ORAL

## 2023-01-31 MED ORDER — METOCLOPRAMIDE HCL 10 MG PO TABS: 10.0000 mg | ORAL_TABLET | Freq: Four times a day (QID) | ORAL | 0 refills | Status: DC

## 2023-01-31 MED ORDER — ALUM & MAG HYDROXIDE-SIMETH 200-200-20 MG/5ML PO SUSP
ORAL | Status: AC
  Filled 2023-01-31: qty 30

## 2023-01-31 MED ORDER — LIDOCAINE VISCOUS HCL 2 % MT SOLN
15.0000 mL | Freq: Once | OROMUCOSAL | Status: AC
  Administered 2023-01-31: 15 mL via OROMUCOSAL

## 2023-01-31 NOTE — ED Triage Notes (Signed)
Chest pain onset this afternoon. Chest pain is central with no radiation. States the pain is on and off sharp pain. No heart history. States heart beat was irregular this morning.  No dizziness, nausea, Lightheadedness, or weakness.

## 2023-01-31 NOTE — ED Provider Notes (Signed)
[facility]    CSN: [id number] Arrival date & time: 01/31/23  1856      History   Chief Complaint Chief Complaint  Patient presents with   Chest Pain    HPI Natasha Bauer is a 67 y.o. female who presents with her husband due to having substernal chest pain since this pm which does not radiate. The pain is described as sharp and is intermittent. But while waiting in the room, she states she belched and feels pain under her L axilla. Has had oral surgery and has been trying to eat solids, but knows she is not chewing the food well. Has been constipated,but denies abdominal pain.     Past Medical History:  Diagnosis Date   Asthma    Colon polyp    hyperplastic in 2004 and 2013    Hypertension age 20   Seasonal allergies    Tenosynovitis, de Quervain    Dr. Amedeo Plenty, resolved    Patient Active Problem List   Diagnosis Date Noted   Family history of colon cancer in mother 09/29/2018   Severe obesity (BMI >= 40) ([place]) 05/27/2015   Right thyroid nodule 05/22/2014   Obesity, Class II, BMI 35-39.9 04/30/2013   Essential hypertension, benign 07/31/2012   Asthma 07/31/2012   Allergic rhinitis 07/31/2012    Past Surgical History:  Procedure Laterality Date   BREAST BIOPSY  08/09/2020   COLONOSCOPY  08/20/12   Dr. Benson Norway (hyperplastic polyp)   LACERATION REPAIR Right 10/16   cut finger   TOTAL ABDOMINAL HYSTERECTOMY W/ BILATERAL SALPINGOOPHORECTOMY  05/2003   fibroids    OB History     Gravida  0   Para  0   Term  0   Preterm  0   AB  0   Living  0      SAB  0   IAB  0   Ectopic  0   Multiple  0   Live Births               Home Medications    Prior to Admission medications   Medication Sig Start Date End Date Taking? Authorizing Provider  metoCLOPramide (REGLAN) 10 MG tablet Take 1 tablet (10 mg total) by mouth every 6 (six) hours. 01/31/23  Yes Rodriguez-Southworth, Sunday Spillers, PA-C  amLODipine (NORVASC) 5 MG tablet Take 1.5  tablets (7.5 mg total) by mouth daily. 12/26/22   Rita Ohara, MD  cholecalciferol (VITAMIN D3) 25 MCG (1000 UNIT) tablet Take 1,000 Units by mouth daily.    [provider]  montelukast (SINGULAIR) 10 MG tablet Take 1 tablet (10 mg total) by mouth at bedtime. 12/26/22   Rita Ohara, MD  Multiple Vitamins-Minerals (CENTRUM SILVER PO) Take 1 tablet by mouth daily.    [provider]  olmesartan-hydrochlorothiazide (BENICAR HCT) 40-25 MG tablet Take 1 tablet by mouth daily. 12/26/22 03/26/23  Rita Ohara, MD    Family History Family History  Problem Relation Age of Onset   Hypertension Father    Congestive Heart Failure Father    Heart disease Father    Cancer Mother 69       colon cancer   Diabetes Mother    Colon cancer Mother 35   Arthritis Sister        rheumatoid arthritis   Hypertension Sister    Arthritis Brother        rheumatoid arthritis    Social History Social History   Tobacco Use   Smoking status:  Former    Packs/day: 0.00    Years: 8.00    Total pack years: 0.00    Types: Cigarettes    Quit date: 12/12/1999    Years since quitting: 23.1   Smokeless tobacco: Never  Vaping Use   Vaping Use: Never used  Substance Use Topics   Alcohol use: No    Alcohol/week: 0.0 standard drinks of alcohol    Comment: sip of wine at Christmas   Drug use: No     Allergies   Codeine, Penicillins, and Sinus & allergy [pseudoephedrine]   Review of Systems Review of Systems  Constitutional:  Negative for fever.  HENT:  Negative for congestion and ear pain.   Respiratory:  Negative for cough, chest tightness and shortness of breath.   Cardiovascular:  Positive for chest pain.  Gastrointestinal:  Positive for abdominal distention and constipation. Negative for abdominal pain.       + a lot of belching  Musculoskeletal:  Negative for gait problem.  Neurological:  Negative for weakness and headaches.     Physical Exam Triage Vital Signs ED Triage Vitals  [01/31/23 1859]  Enc Vitals Group     BP (!) 144/76     Pulse Rate (!) 104     Resp 16     Temp 97.8 F (36.6 C)     Temp Source Oral     SpO2 100 %     Weight      Height      Head Circumference      Peak Flow      Pain Score      Pain Loc      Pain Edu?      Excl. in [place]?    No data found.  Updated Vital Signs BP (!) 144/76 (BP Location: Right Arm)   Pulse (!) 104   Temp 97.8 F (36.6 C) (Oral)   Resp 16   SpO2 100%   Visual Acuity Right Eye Distance:   Left Eye Distance:   Bilateral Distance:    Right Eye Near:   Left Eye Near:    Bilateral Near:     Physical Exam Vitals and nursing note reviewed.  Constitutional:      General: She is not in acute distress.    Appearance: She is obese. She is not toxic-appearing.     Comments: She was belching a lot in the room  HENT:     Right Ear: External ear normal.     Left Ear: External ear normal.  Eyes:     General: No scleral icterus.    Conjunctiva/sclera: Conjunctivae normal.  Cardiovascular:     Rate and Rhythm: Normal rate and regular rhythm.     Comments: Seems to have a murmur sound on subclavial region on the L Pulmonary:     Effort: Pulmonary effort is normal.     Breath sounds: Normal breath sounds.  Abdominal:     General: Bowel sounds are normal.     Palpations: Abdomen is soft. There is no mass.     Tenderness: There is no abdominal tenderness. There is no guarding or rebound.  Musculoskeletal:        General: No tenderness or deformity. Normal range of motion.     Cervical back: Normal range of motion.     Comments: I was able to reproduce the pain when I palpated her L pectoralis muscle close to her axilla. No masses palpated  Skin:    General:  Skin is warm and dry.     Findings: No rash.  Neurological:     Mental Status: She is alert and oriented to person, place, and time.     Gait: Gait normal.  Psychiatric:        Mood and Affect: Mood normal.        Behavior: Behavior normal.         Thought Content: Thought content normal.        Judgment: Judgment normal.      UC Treatments / Results  Labs (all labs ordered are listed, but only abnormal results are displayed) Labs Reviewed - No data to display  EKG NSR with non specific ST changes and when compared to EKG from 2/3/'23 is unchanged.   Radiology No results found.  Procedures Procedures (including critical care time)  Medications Ordered in UC Medications  alum & mag hydroxide-simeth (MAALOX/MYLANTA) 200-200-20 MG/5ML suspension 30 mL (30 mLs Oral Given 01/31/23 2017)  lidocaine (XYLOCAINE) 2 % viscous mouth solution 15 mL (15 mLs Mouth/Throat Given 01/31/23 2017)    Initial Impression / Assessment and Plan / UC Course  I have reviewed the triage vital signs and the nursing notes.  Pertinent  imaging results that were available during my care of the patient were reviewed by me and considered in my medical decision making (see chart for details). She was given GI cocktail and relieved her symptoms by 80% but the L lateral pectoralis was still a little painful.   I advised her to d/c solids and do bland soft foods until she is able to chew well She was placed on Reglan as noted and advised to get digestive enzymes to take before meals to help her digest better. Must go to ER if the L chest, axilla pain gets worse.   Final Clinical Impressions(s) / UC Diagnoses   Final diagnoses:  Esophageal spasm     Discharge Instructions      Get digestive enzymes and take it before your meals to help you digest your food better.   If the L axilla pain area gets worse tonight, please go to the ER     ED Prescriptions     Medication Sig Dispense Auth. Provider   metoCLOPramide (REGLAN) 10 MG tablet Take 1 tablet (10 mg total) by mouth every 6 (six) hours. 15 tablet Rodriguez-Southworth, Sunday Spillers, PA-C      PDMP not reviewed this encounter.   Shelby Mattocks, Hershal Coria 01/31/23 2051

## 2023-01-31 NOTE — Discharge Instructions (Addendum)
Get digestive enzymes and take it before your meals to help you digest your food better.   If the L axilla pain area gets worse tonight, please go to the ER

## 2023-02-06 ENCOUNTER — Other Ambulatory Visit: Payer: Self-pay | Admitting: *Deleted

## 2023-02-06 DIAGNOSIS — R221 Localized swelling, mass and lump, neck: Secondary | ICD-10-CM

## 2023-02-10 ENCOUNTER — Ambulatory Visit (HOSPITAL_BASED_OUTPATIENT_CLINIC_OR_DEPARTMENT_OTHER)
Admission: RE | Admit: 2023-02-10 | Discharge: 2023-02-10 | Disposition: A | Discharge status: Home / Self Care | Payer: Commercial Managed Care - PPO | Source: Ambulatory Visit | Attending: Family Medicine | Admitting: Family Medicine

## 2023-02-10 DIAGNOSIS — R221 Localized swelling, mass and lump, neck: Secondary | ICD-10-CM | POA: Diagnosis present

## 2023-02-10 DIAGNOSIS — R0989 Other specified symptoms and signs involving the circulatory and respiratory systems: Secondary | ICD-10-CM | POA: Insufficient documentation

## 2023-02-13 ENCOUNTER — Other Ambulatory Visit: Payer: Self-pay | Admitting: Family Medicine

## 2023-02-13 ENCOUNTER — Other Ambulatory Visit: Payer: Self-pay

## 2023-02-13 ENCOUNTER — Other Ambulatory Visit (HOSPITAL_COMMUNITY): Payer: Self-pay

## 2023-02-13 DIAGNOSIS — J309 Allergic rhinitis, unspecified: Secondary | ICD-10-CM

## 2023-02-13 MED ORDER — FLUTICASONE PROPIONATE 50 MCG/ACT NA SUSP
2.0000 | Freq: Every day | NASAL | 5 refills | Status: AC
  Filled 2023-02-13: qty 16, 30d supply, fill #0

## 2023-02-13 MED ORDER — ALBUTEROL SULFATE HFA 108 (90 BASE) MCG/ACT IN AERS
2.0000 | INHALATION_SPRAY | Freq: Four times a day (QID) | RESPIRATORY_TRACT | 1 refills | Status: DC | PRN
  Filled 2023-02-13: qty 6.7, 25d supply, fill #0

## 2023-02-13 NOTE — Telephone Encounter (Signed)
Is this okay to refill? 

## 2023-02-15 ENCOUNTER — Other Ambulatory Visit: Payer: Self-pay

## 2023-02-15 ENCOUNTER — Encounter (HOSPITAL_COMMUNITY): Payer: Self-pay

## 2023-02-15 ENCOUNTER — Other Ambulatory Visit (HOSPITAL_COMMUNITY): Payer: Self-pay

## 2023-02-16 ENCOUNTER — Encounter (HOSPITAL_COMMUNITY): Payer: Self-pay

## 2023-02-16 ENCOUNTER — Ambulatory Visit (HOSPITAL_COMMUNITY)
Admission: EM | Admit: 2023-02-16 | Discharge: 2023-02-16 | Disposition: A | Discharge status: Home / Self Care | Payer: Commercial Managed Care - PPO | Attending: Emergency Medicine | Admitting: Emergency Medicine

## 2023-02-16 DIAGNOSIS — J0120 Acute ethmoidal sinusitis, unspecified: Secondary | ICD-10-CM

## 2023-02-16 DIAGNOSIS — J069 Acute upper respiratory infection, unspecified: Secondary | ICD-10-CM

## 2023-02-16 MED ORDER — AZITHROMYCIN 250 MG PO TABS: 250.0000 mg | ORAL_TABLET | Freq: Every day | ORAL | 0 refills | Status: DC

## 2023-02-16 NOTE — Discharge Instructions (Addendum)
Your symptoms are consistent with a bacterial upper respiratory infection. Please take all antibiotics as prescribed.  Continue with your Mucinex and Flonase.  You can sleep with a humidifier, continue with your tea and honey and other symptomatic measures.  Please return to clinic if you develop fever, worsening of symptoms, or no change after finishing antibiotics.

## 2023-02-16 NOTE — ED Provider Notes (Signed)
[facility]    CSN: [id number] Arrival date & time: 02/16/23  0803      History   Chief Complaint Chief Complaint  Patient presents with   Sore Throat    HPI Natasha Bauer is a 67 y.o. female.   Patient presents to clinic with sore throat, nasal congestion, sinus pain and bilateral ear pain / pressure.  She has been taking Mucinex and Flonase without relief.  She denies fever, but does endorse chills at night.  Denies chest pain, cough, shortness of breath.  Denies abdominal pain.    The history is provided by the patient and medical records.  Sore Throat Pertinent negatives include no chest pain, no abdominal pain and no shortness of breath.    Past Medical History:  Diagnosis Date   Asthma    Colon polyp    hyperplastic in 2004 and 2013    Hypertension age 4   Seasonal allergies    Tenosynovitis, de Quervain    Dr. Amedeo Plenty, resolved    Patient Active Problem List   Diagnosis Date Noted   Family history of colon cancer in mother 09/29/2018   Severe obesity (BMI >= 40) ([place]) 05/27/2015   Right thyroid nodule 05/22/2014   Obesity, Class II, BMI 35-39.9 04/30/2013   Essential hypertension, benign 07/31/2012   Asthma 07/31/2012   Allergic rhinitis 07/31/2012    Past Surgical History:  Procedure Laterality Date   BREAST BIOPSY  08/09/2020   COLONOSCOPY  08/20/12   Dr. Benson [place] (hyperplastic polyp)   LACERATION REPAIR Right 10/16   cut finger   TOTAL ABDOMINAL HYSTERECTOMY W/ BILATERAL SALPINGOOPHORECTOMY  05/2003   fibroids    OB History     Gravida  0   Para  0   Term  0   Preterm  0   AB  0   Living  0      SAB  0   IAB  0   Ectopic  0   Multiple  0   Live Births               Home Medications    Prior to Admission medications   Medication Sig Start Date End Date Taking? Authorizing Provider  albuterol (VENTOLIN HFA) 108 (90 Base) MCG/ACT inhaler Inhale 2 puffs into the lungs every 6 (six) hours as needed for  wheezing or shortness of breath. 02/13/23  Yes Rita Ohara, MD  amLODipine (NORVASC) 5 MG tablet Take 1.5 tablets (7.5 mg total) by mouth daily. 12/26/22  Yes Rita Ohara, MD  azithromycin (ZITHROMAX) 250 MG tablet Take 1 tablet (250 mg total) by mouth daily. Take first 2 tablets together, then 1 every day until finished. 02/16/23  Yes Louretta Shorten, [place], FNP  cholecalciferol (VITAMIN D3) 25 MCG (1000 UNIT) tablet Take 1,000 Units by mouth daily.   Yes [provider]  fluticasone (FLONASE) 50 MCG/ACT nasal spray Place 2 sprays into both nostrils daily. 02/13/23  Yes Rita Ohara, MD  metoCLOPramide (REGLAN) 10 MG tablet Take 1 tablet (10 mg total) by mouth every 6 (six) hours. 01/31/23  Yes [place], Sunday Spillers, PA-C  montelukast (SINGULAIR) 10 MG tablet Take 1 tablet (10 mg total) by mouth at bedtime. 12/26/22  Yes Rita Ohara, MD  Multiple Vitamins-Minerals (CENTRUM SILVER PO) Take 1 tablet by mouth daily.   Yes [provider]  olmesartan-hydrochlorothiazide (BENICAR HCT) 40-25 MG tablet Take 1 tablet by mouth daily. 12/26/22 03/26/23 Yes Rita Ohara, MD    Family History  Family History  Problem Relation Age of Onset   Hypertension Father    Congestive Heart Failure Father    Heart disease Father    Cancer Mother 72       colon cancer   Diabetes Mother    Colon cancer Mother 21   Arthritis Sister        rheumatoid arthritis   Hypertension Sister    Arthritis Brother        rheumatoid arthritis    Social History Social History   Tobacco Use   Smoking status: Former    Packs/day: 0.00    Years: 8.00    Total pack years: 0.00    Types: Cigarettes    Quit date: 12/12/1999    Years since quitting: 23.1   Smokeless tobacco: Never  Vaping Use   Vaping Use: Never used  Substance Use Topics   Alcohol use: No    Alcohol/week: 0.0 standard drinks of alcohol    Comment: sip of wine at Christmas   Drug use: No     Allergies   Codeine, Penicillins, and Sinus & allergy  [pseudoephedrine]   Review of Systems Review of Systems  Constitutional:  Positive for chills. Negative for fever.  HENT:  Positive for postnasal drip, rhinorrhea, sinus pain and sore throat. Negative for ear pain.   Eyes:  Negative for pain and visual disturbance.  Respiratory:  Negative for cough and shortness of breath.   Cardiovascular:  Negative for chest pain and palpitations.  Gastrointestinal:  Negative for abdominal pain and vomiting.  Genitourinary:  Negative for dysuria and hematuria.  Musculoskeletal:  Negative for arthralgias and back pain.  Skin:  Negative for color change and rash.  Neurological:  Negative for seizures and syncope.  All other systems reviewed and are negative.    Physical Exam Triage Vital Signs ED Triage Vitals  Enc Vitals Group     BP 02/16/23 0820 129/85     Pulse Rate 02/16/23 0820 83     Resp 02/16/23 0820 16     Temp 02/16/23 0820 98.6 F (37 C)     Temp Source 02/16/23 0820 Oral     SpO2 02/16/23 0820 95 %     Weight 02/16/23 0820 190 lb (86.2 kg)     Height 02/16/23 0820 '5\' 1"'$  (1.549 m)     Head Circumference --      Peak Flow --      Pain Score 02/16/23 0818 7     Pain Loc --      Pain Edu? --      Excl. in [place]? --    No data found.  Updated Vital Signs BP 129/85 (BP Location: Left Arm)   Pulse 83   Temp 98.6 F (37 C) (Oral)   Resp 16   Ht '5\' 1"'$  (1.549 m)   Wt 190 lb (86.2 kg)   SpO2 95%   BMI 35.90 kg/m   Visual Acuity Right Eye Distance:   Left Eye Distance:   Bilateral Distance:    Right Eye Near:   Left Eye Near:    Bilateral Near:     Physical Exam Vitals and nursing note reviewed.  Constitutional:      General: She is not in acute distress.    Appearance: She is well-developed.  HENT:     Head: Normocephalic and atraumatic.     Right Ear: Tympanic membrane, ear canal and external ear normal. No drainage or swelling.     Left  Ear: Tympanic membrane, ear canal and external ear normal. No drainage or  swelling.     Nose: Congestion and rhinorrhea present. No nasal deformity, signs of injury or nasal tenderness.     Right Turbinates: Enlarged.     Left Turbinates: Enlarged.     Right Sinus: Maxillary sinus tenderness and frontal sinus tenderness present.     Left Sinus: Maxillary sinus tenderness and frontal sinus tenderness present.     Mouth/Throat:     Mouth: Mucous membranes are moist. No oral lesions.     Pharynx: Posterior oropharyngeal erythema present. No pharyngeal swelling, oropharyngeal exudate or uvula swelling.     Tonsils: No tonsillar exudate or tonsillar abscesses. 2+ on the right. 2+ on the left.  Eyes:     Conjunctiva/sclera: Conjunctivae normal.     Pupils: Pupils are equal, round, and reactive to light.  Cardiovascular:     Rate and Rhythm: Normal rate and regular rhythm.     Heart sounds: Normal heart sounds. No murmur heard. Pulmonary:     Effort: Pulmonary effort is normal. No respiratory distress.     Breath sounds: Normal breath sounds.  Abdominal:     Palpations: Abdomen is soft.     Tenderness: There is no abdominal tenderness.  Musculoskeletal:        General: No swelling.     Cervical back: Normal range of motion and neck supple.  Lymphadenopathy:     Cervical: Cervical adenopathy present.  Skin:    General: Skin is warm and dry.     Capillary Refill: Capillary refill takes less than 2 seconds.  Neurological:     Mental Status: She is alert.  Psychiatric:        Mood and Affect: Mood normal.        Behavior: Behavior is cooperative.      UC Treatments / Results  Labs (all labs ordered are listed, but only abnormal results are displayed) Labs Reviewed - No data to display  EKG   Radiology No results found.  Procedures Procedures (including critical care time)  Medications Ordered in UC Medications - No data to display  Initial Impression / Assessment and Plan / UC Course  I have reviewed the triage vital signs and the nursing  notes.  Pertinent labs & imaging results that were available during my care of the patient were reviewed by me and considered in my medical decision making (see chart for details).  Vitals and triage reviewed, patient is hemodynamically stable.  Posterior pharynx with erythema, frontal and maxillary sinus tenderness. Lungs vesicular, low concern for PNA. Due to duration and symptom presentation, will cover for bacterial URI.  Allergic to penicillins. Zpack and symptomatic management.  Plan of care discussed, return precautions and follow-up care discussed.  Patient verbalized understanding.  No questions at this time.    Final Clinical Impressions(s) / UC Diagnoses   Final diagnoses:  Upper respiratory tract infection, unspecified type     Discharge Instructions      Your symptoms are consistent with a bacterial upper respiratory infection. Please take all antibiotics as prescribed.  Continue with your Mucinex and Flonase.  You can sleep with a humidifier, continue with your tea and honey and other symptomatic measures.  Please return to clinic if you develop fever, worsening of symptoms, or no change after finishing antibiotics.     ED Prescriptions     Medication Sig Dispense Auth. Provider   azithromycin (ZITHROMAX) 250 MG tablet Take 1 tablet (250  mg total) by mouth daily. Take first 2 tablets together, then 1 every day until finished. 6 tablet [place], [place], [place]      I have reviewed the PDMP during this encounter.   Louretta Shorten [place], [place] 02/16/23 [phone]

## 2023-02-16 NOTE — ED Triage Notes (Signed)
Patient presenting with sore throat, congestion, and pain in the ears. Patient took Mucinex with no relief. Onset 1 week. Patient states husband was sick first.

## 2023-03-26 ENCOUNTER — Other Ambulatory Visit: Payer: Self-pay

## 2023-03-26 ENCOUNTER — Other Ambulatory Visit: Payer: Self-pay | Admitting: Family Medicine

## 2023-03-26 ENCOUNTER — Other Ambulatory Visit (HOSPITAL_COMMUNITY): Payer: Self-pay

## 2023-03-26 DIAGNOSIS — J45909 Unspecified asthma, uncomplicated: Secondary | ICD-10-CM

## 2023-03-26 DIAGNOSIS — I10 Essential (primary) hypertension: Secondary | ICD-10-CM

## 2023-03-26 MED ORDER — AMLODIPINE BESYLATE 5 MG PO TABS
7.5000 mg | ORAL_TABLET | Freq: Every day | ORAL | 0 refills | Status: DC
  Filled 2023-03-26: qty 135, 90d supply, fill #0

## 2023-03-26 MED ORDER — OLMESARTAN MEDOXOMIL-HCTZ 40-25 MG PO TABS
1.0000 | ORAL_TABLET | Freq: Every day | ORAL | 0 refills | Status: DC
  Filled 2023-03-26: qty 90, 90d supply, fill #0

## 2023-03-26 MED ORDER — MONTELUKAST SODIUM 10 MG PO TABS
10.0000 mg | ORAL_TABLET | Freq: Every day | ORAL | 0 refills | Status: DC
  Filled 2023-03-26: qty 90, 90d supply, fill #0

## 2023-03-27 ENCOUNTER — Encounter (HOSPITAL_COMMUNITY): Payer: Self-pay

## 2023-03-27 ENCOUNTER — Other Ambulatory Visit (HOSPITAL_COMMUNITY): Payer: Self-pay

## 2023-04-27 IMAGING — CR DG CHEST 1V PORT
1 series · 1 of 1 positions shown · non-contrast
Comparison: None.

CLINICAL DATA: Chest pain

EXAM:
PORTABLE CHEST 1 VIEW

[chest pa]
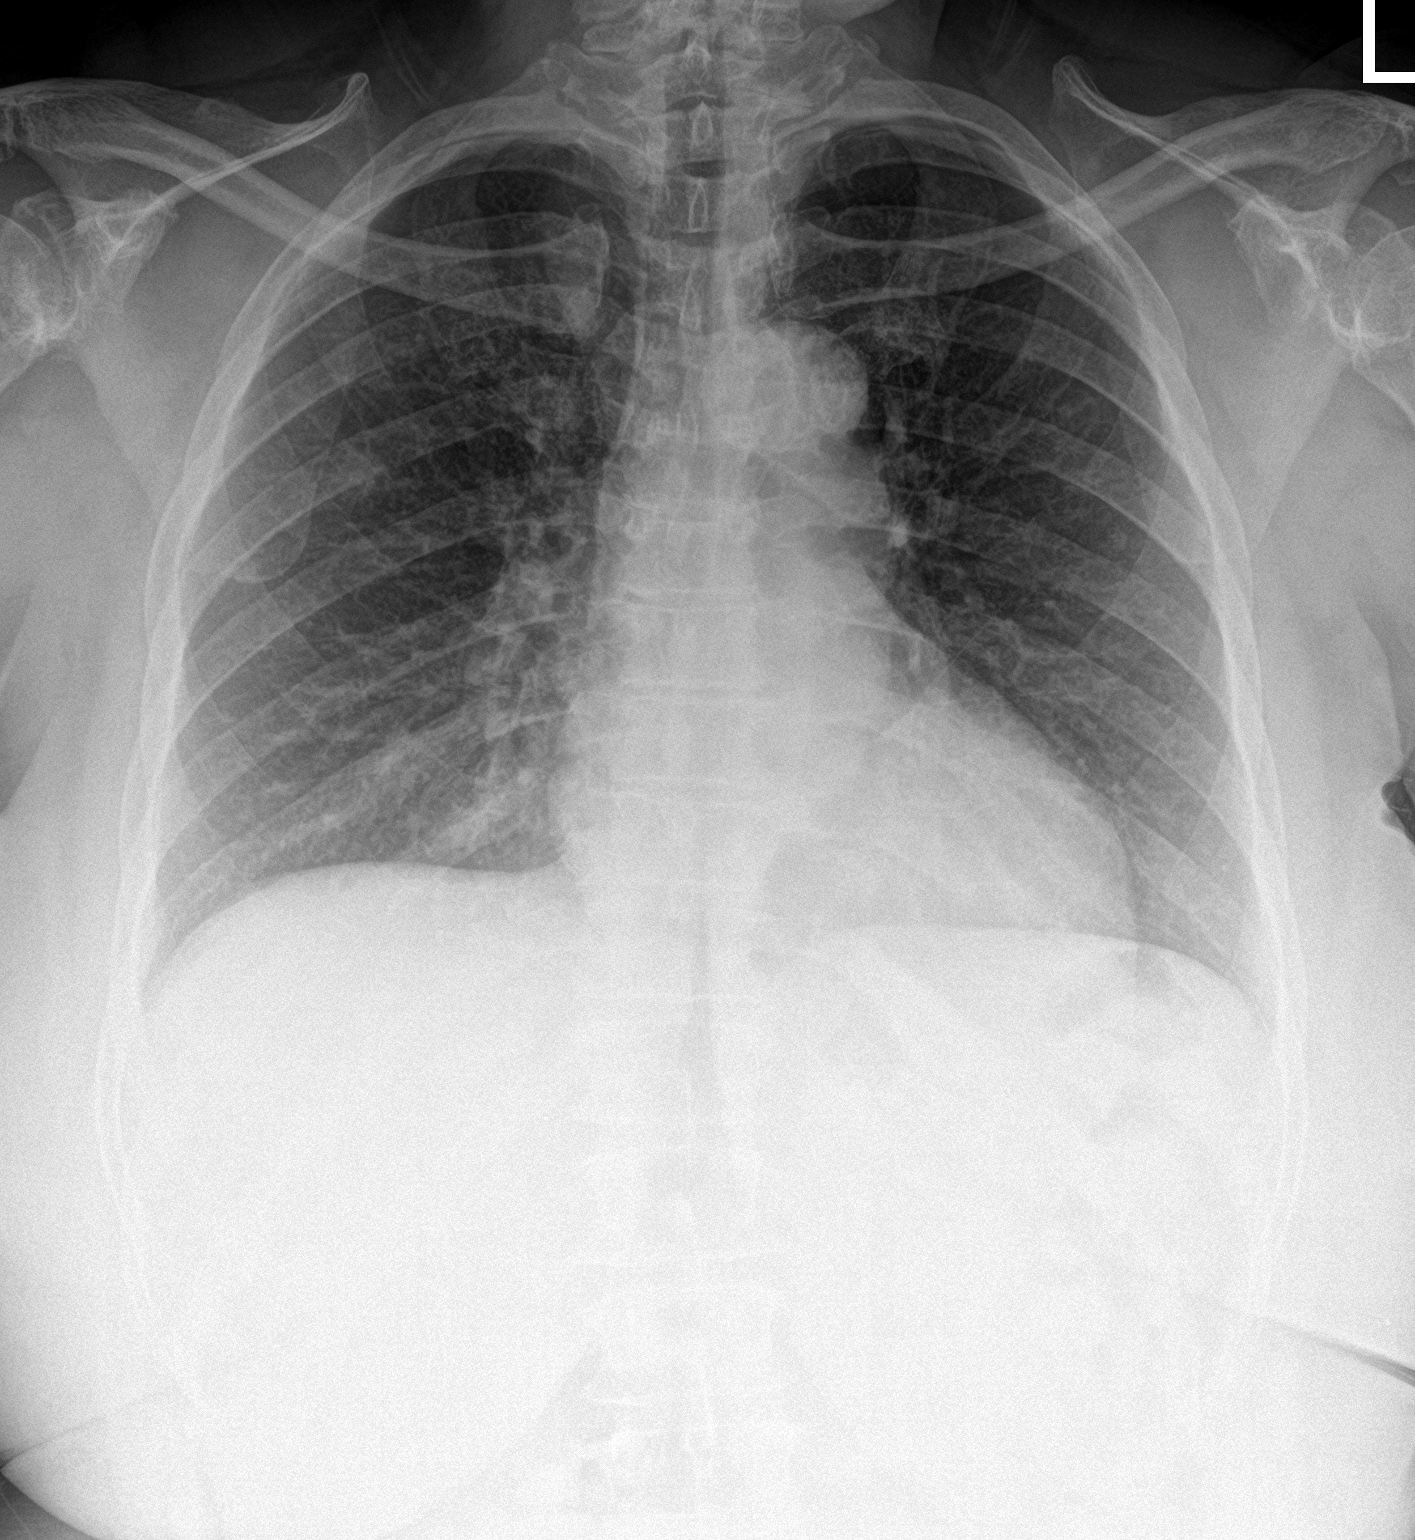

[1 of 1 positions shown; findings below may reference images not displayed]

FINDINGS: The heart size and mediastinal contours are within normal limits.
Both lungs are clear. The visualized skeletal structures are
unremarkable.
IMPRESSION: No active disease.

## 2023-05-05 DIAGNOSIS — H524 Presbyopia: Secondary | ICD-10-CM | POA: Diagnosis not present

## 2023-05-05 DIAGNOSIS — H5203 Hypermetropia, bilateral: Secondary | ICD-10-CM | POA: Diagnosis not present

## 2023-06-21 ENCOUNTER — Other Ambulatory Visit: Payer: Self-pay | Admitting: Family Medicine

## 2023-06-21 ENCOUNTER — Other Ambulatory Visit (HOSPITAL_COMMUNITY): Payer: Self-pay

## 2023-06-21 DIAGNOSIS — I10 Essential (primary) hypertension: Secondary | ICD-10-CM

## 2023-06-21 DIAGNOSIS — J45909 Unspecified asthma, uncomplicated: Secondary | ICD-10-CM

## 2023-06-21 MED ORDER — OLMESARTAN MEDOXOMIL-HCTZ 40-25 MG PO TABS
1.0000 | ORAL_TABLET | Freq: Every day | ORAL | 0 refills | Status: DC
  Filled 2023-06-21: qty 90, 90d supply, fill #0

## 2023-06-21 MED ORDER — MONTELUKAST SODIUM 10 MG PO TABS
10.0000 mg | ORAL_TABLET | Freq: Every day | ORAL | 0 refills | Status: DC
  Filled 2023-06-21: qty 90, 90d supply, fill #0

## 2023-06-21 MED ORDER — AMLODIPINE BESYLATE 5 MG PO TABS
7.5000 mg | ORAL_TABLET | Freq: Every day | ORAL | 0 refills | Status: DC
  Filled 2023-06-21: qty 135, 90d supply, fill #0

## 2023-07-31 DIAGNOSIS — R7301 Impaired fasting glucose: Secondary | ICD-10-CM | POA: Insufficient documentation

## 2023-07-31 DIAGNOSIS — R220 Localized swelling, mass and lump, head: Secondary | ICD-10-CM | POA: Insufficient documentation

## 2023-07-31 DIAGNOSIS — E559 Vitamin D deficiency, unspecified: Secondary | ICD-10-CM | POA: Insufficient documentation

## 2023-07-31 NOTE — Progress Notes (Unsigned)
No chief complaint on file.    Hypertension follow-up: She reports compliance in taking 7.5mg  of amlodipine and Benicar-HCT. BP's are running   Wrist monitor was verified as accurate in 07/2022.  She denies headaches, dizziness, chest pain, palpitations, muscle cramps  Tries to limit salt in her diet--she limits/avoids lunchmeat, sausage, french fries (makes herself in air fryer, no salt), chips, bacon. No canned foods. Some of the seasoning she uses has sodium.   Asthma/allergies:  She takes Singulair daily.  She uses albuterol prn, sometimes needs it during allergy season.  Last used it ***UPDATE Allergies have been controlled with the singulair, not needing antihistamines or Flonase.  Last spirometry was normal in 09/2018.   Obesity: She lost weight related to dietary changes from getting dentures.  She can now eat anything, but she continues to bake or air fry her foods, and has lost further weight.  Not getting much exercise. UPDATE***  Wt Readings from Last 3 Encounters:  02/16/23 190 lb (86.2 kg)  01/24/23 205 lb 6.4 oz (93.2 kg)  07/19/22 210 lb 6.4 oz (95.4 kg)      Pre-diabetes: In 09/2019 she was noted to have elevated fasting glucose of 107. Last A1c was 5.9% in 01/2023. Doesn't eat much bread, occasional baked potato, avoids sweets.  Has very small amount of ginger ale, 1/2 cup, every few days. She no longer drinks fruit juices. Mainly drinks water.    Vitamin D deficiency:  Last level was 32.8 in 01/2023, when taking 1000 IU of D3 plus MVI daily (had been low at 28.8 in 01/2022 when hadn't taken D3 for a month) She is currently taking 1000 IU of D3 plus her MVI.   H/o thyroid nodules--s/p ultrasound and biopsy in 2015. Denies changes, symptoms. This was last evaluated with ultrasound in July 2019, no f/u needed (since the R thyroid nodule that was biopsied was benign, benign follicular nodule).She denies any change to her thyroid, no dysphagia. Thyroid was NOT  evaluated on recent US of soft tissues.   She has a persistent lump on L neck, which she reports hasn't changed. This was evaluated with US soft tissue head/neck in 02/2023: FINDINGS: Heterogeneous, solid mass in the left submandibular space most closely associated with the left submandibular gland. The mass measures up to 3.3 cm. No regional adenopathy seen.   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  She was advised of the recommendation for ENT referral and CT scan, but reported not having the finances for that at that time.   Carotid US was done 02/2023 to due noting bruit on exam: IMPRESSION: No evidence of focal plaque or evidence of carotid stenosis in the neck bilaterally. Both internal carotid arteries are tortuous, likely reflective of longstanding hypertension.     PMH, PSH, SH reviewed    ROS:   PHYSICAL EXAM:  There were no vitals taken for this visit.  Wt Readings from Last 3 Encounters:  02/16/23 190 lb (86.2 kg)  01/24/23 205 lb 6.4 oz (93.2 kg)  07/19/22 210 lb 6.4 oz (95.4 kg)   Pleasant, well-appearing, obese female in good spirits. HEENT: conjunctiva and sclera are clear, EOMI. OP clear, upper and lower dentures. Neck: no lymphadenopathy. Thyroid mildly enlarged, slightly nodular, no dominant mass.  L BRUIT??*** Nontender, mobile submandibular mass is present on the left, measuring  3-3.5x4 cm Heart: regular rate and rhythm Lungs: clear bilaterally Back: no spinal  or CVA tenderness Abdomen: obese, soft, nontender Extremities: no pitting edema. 2+ pulses Psych: normal mood, affect, hygiene and grooming Neuro: alert and oriented, normal strength, gait  Update L mass size ?carotid bruit on L?   ASSESSMENT/PLAN:    ENT referral? CT? (Rec based on abnl [place] of neck; pt declined in March due to cost, ?if affordable now)   F/u 01/2024 for  CPE as scheduled (fasting visit)

## 2023-08-01 ENCOUNTER — Encounter: Payer: Self-pay | Admitting: Family Medicine

## 2023-08-01 ENCOUNTER — Other Ambulatory Visit: Payer: Self-pay

## 2023-08-01 ENCOUNTER — Ambulatory Visit (INDEPENDENT_AMBULATORY_CARE_PROVIDER_SITE_OTHER): Payer: Commercial Managed Care - PPO | Admitting: Family Medicine

## 2023-08-01 VITALS — BP 132/84 | HR 76 | Ht 60.5 in | Wt 214.4 lb

## 2023-08-01 DIAGNOSIS — E559 Vitamin D deficiency, unspecified: Secondary | ICD-10-CM | POA: Diagnosis not present

## 2023-08-01 DIAGNOSIS — J309 Allergic rhinitis, unspecified: Secondary | ICD-10-CM

## 2023-08-01 DIAGNOSIS — R220 Localized swelling, mass and lump, head: Secondary | ICD-10-CM | POA: Diagnosis not present

## 2023-08-01 DIAGNOSIS — Z6841 Body Mass Index (BMI) 40.0 and over, adult: Secondary | ICD-10-CM

## 2023-08-01 DIAGNOSIS — R7301 Impaired fasting glucose: Secondary | ICD-10-CM | POA: Diagnosis not present

## 2023-08-01 DIAGNOSIS — I10 Essential (primary) hypertension: Secondary | ICD-10-CM

## 2023-08-01 DIAGNOSIS — J4520 Mild intermittent asthma, uncomplicated: Secondary | ICD-10-CM

## 2023-08-01 MED ORDER — AMLODIPINE BESYLATE 10 MG PO TABS
10.0000 mg | ORAL_TABLET | Freq: Every day | ORAL | 1 refills | Status: DC
  Filled 2023-08-01: qty 90, 90d supply, fill #0
  Filled 2023-10-25: qty 90, 90d supply, fill #1

## 2023-08-01 NOTE — Assessment & Plan Note (Signed)
Continue daily supplements 

## 2023-08-01 NOTE — Assessment & Plan Note (Signed)
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  Has had some slight, gradual increase in size. Discussed options of checking CT and/or ENT referral.  She declines either of these at the moment, due to $ issues. Understands the potential for a tumor/cancer.  She will contact us when she is ready for this, especially if she notices any changes (increase in size, pain, etc)

## 2023-08-01 NOTE — Assessment & Plan Note (Signed)
She declined having A1c done today (worried about costs). Discussed that given her weight gain and her significantly increased sugar intake through her beverage choices, that her A1c is likely higher than previous value of 5.9.   She reports she will get her sugar and weight down, she will eliminate lemonade and sweet tea.  Reviewed dietary recommendations, exercise, weight loss.  Repeat A1c at her physical.

## 2023-08-01 NOTE — Assessment & Plan Note (Signed)
Currently controlled with montelukast alone. To restart Flonase and/or antihistamine if/when needed

## 2023-08-01 NOTE — Assessment & Plan Note (Signed)
Counseled re: diet, exercise, risks of obesity. She is motivated to make changes and get the weight down.

## 2023-08-01 NOTE — Assessment & Plan Note (Signed)
BP suboptimally controlled on 7.5 mg of amlodipine and Benicar-HCT.  Increase amlodipine to 10mg . To continue to monitor BP and to call with values in 2-4 weeks.  Encouraged low sodium diet, daily exercise, and weight loss (by cutting out sugary beverages and continuing healthy diet). She can finish up current 5mg  tablet supply by doubling up; 10mg  dose sent in.

## 2023-08-01 NOTE — Patient Instructions (Addendum)
Please cut out sweet tea and lemonade.  Try and avoid all beverages with calories that do not have any nutritional benefit (fine to drink lowfat milk, occasional small juice--I'd rather you eat the fruit than drink any juice). You need to get back to just drinking water. This will help lower sugar and weight.  Expect that your allergies might flare this Fall.  Restart taking Flonase, and/or an antihistamine such as claritin or allegra or zyrtec, if needed.  Remember that controlling the allergies might help your breathing.  Try and get 150 minutes of exercise each week. You can do this by getting on your bike more often (can be in 10-15 minute intervals, more than once a day if you can't do it for longer at one time).  We are changing your amlodipine dose to 10 mg daily. Finish up your 5 mg tablet supply by taking 2 at the same time. The new prescription is for 10 mg, so only take ONE pill of the 10 mg. Continue the Benicar HCT daily.  Please continue to monitor your blood pressure. Call Suzette Battiest (or send [place] a note through MyChart) with your blood pressures after 2-3 weeks. If your blood pressure drops too low, or if you are feeling dizzy, please let us know sooner.  Please contact us to schedule a visit with ENT (and/or CT scan) when you are ready, and definitely if you notice any change in size.

## 2023-08-01 NOTE — Assessment & Plan Note (Signed)
In remission. Continue montelukast daily.  Sometimes is triggered by allergies, so encouraged her to restart Flonase and/or antihistamine in the Fall when allergies might worsen.  She reports having albuterol at home for prn use.

## 2023-08-15 DIAGNOSIS — Z1231 Encounter for screening mammogram for malignant neoplasm of breast: Secondary | ICD-10-CM | POA: Diagnosis not present

## 2023-08-15 LAB — HM MAMMOGRAPHY

## 2023-08-27 ENCOUNTER — Encounter: Payer: Self-pay | Admitting: *Deleted

## 2023-09-05 ENCOUNTER — Encounter (HOSPITAL_COMMUNITY): Payer: Self-pay

## 2023-09-05 ENCOUNTER — Ambulatory Visit (HOSPITAL_COMMUNITY)
Admission: EM | Admit: 2023-09-05 | Discharge: 2023-09-05 | Disposition: A | Discharge status: Home / Self Care | Payer: Commercial Managed Care - PPO | Attending: Physician Assistant | Admitting: Physician Assistant

## 2023-09-05 ENCOUNTER — Other Ambulatory Visit (HOSPITAL_BASED_OUTPATIENT_CLINIC_OR_DEPARTMENT_OTHER): Payer: Self-pay

## 2023-09-05 ENCOUNTER — Other Ambulatory Visit (HOSPITAL_COMMUNITY): Payer: Self-pay

## 2023-09-05 DIAGNOSIS — J069 Acute upper respiratory infection, unspecified: Secondary | ICD-10-CM

## 2023-09-05 MED ORDER — DOXYCYCLINE HYCLATE 100 MG PO CAPS
100.0000 mg | ORAL_CAPSULE | Freq: Two times a day (BID) | ORAL | 0 refills | Status: DC
  Filled 2023-09-05: qty 14, 7d supply, fill #0

## 2023-09-05 MED ORDER — DOXYCYCLINE HYCLATE 100 MG PO CAPS: 100.0000 mg | ORAL_CAPSULE | Freq: Two times a day (BID) | ORAL | 0 refills | Status: AC

## 2023-09-05 MED ORDER — IPRATROPIUM BROMIDE 0.03 % NA SOLN
2.0000 | Freq: Three times a day (TID) | NASAL | 0 refills | Status: DC
  Filled 2023-09-05: qty 30, 50d supply, fill #0
  Filled 2023-09-05: qty 30, 25d supply, fill #0

## 2023-09-05 NOTE — ED Triage Notes (Signed)
Presents to the office for bilateral ear pain and pressure. Pt reports cough and congestion.

## 2023-09-05 NOTE — ED Provider Notes (Signed)
[facility]    CSN: [id number] Arrival date & time: 09/05/23  1528      History   Chief Complaint Chief Complaint  Patient presents with   Cough   Nasal Congestion   Ear Pain    HPI Natasha Bauer is a 67 y.o. female.    Cough Associated symptoms: ear pain (fullness) and rhinorrhea   Associated symptoms: no chest pain, no chills, no fever, no headaches and no sore throat    Sick for 5 days at times include rhinorrhea, nasal congestion, nonproductive cough.  Taking Mucinex and Flonase without relief.  Husband has also been sick, but improved after 3 to 4 days.  No confirmed COVID exposures. Denies fever, chills, SOB, chest pain, N/V/D Pt concerned about sinus infection.  Past Medical History:  Diagnosis Date   Asthma    Colon polyp    hyperplastic in 2004 and 2013    Hypertension age 35   Seasonal allergies    Tenosynovitis, de Quervain    Dr. Amanda Pea, resolved    Patient Active Problem List   Diagnosis Date Noted   Mass of left submandibular region 07/31/2023   Vitamin D deficiency 07/31/2023   Impaired fasting glucose 07/31/2023   Family history of colon cancer in mother 09/29/2018   Class 3 severe obesity due to excess calories with serious comorbidity and body mass index (BMI) of 40.0 to 44.9 in adult (HCC) 05/27/2015   Right thyroid nodule 05/22/2014   Obesity, Class II, BMI 35-39.9 04/30/2013   Essential hypertension, benign 07/31/2012   Asthma 07/31/2012   Allergic rhinitis 07/31/2012    Past Surgical History:  Procedure Laterality Date   BREAST BIOPSY  08/09/2020   COLONOSCOPY  08/20/12   Dr. Elnoria Howard (hyperplastic polyp)   LACERATION REPAIR Right 10/16   cut finger   TOTAL ABDOMINAL HYSTERECTOMY W/ BILATERAL SALPINGOOPHORECTOMY  05/2003   fibroids    OB History     Gravida  0   Para  0   Term  0   Preterm  0   AB  0   Living  0      SAB  0   IAB  0   Ectopic  0   Multiple  0   Live Births                Home Medications    Prior to Admission medications   Medication Sig Start Date End Date Taking? Authorizing Provider  doxycycline (VIBRAMYCIN) 100 MG capsule Take 1 capsule (100 mg total) by mouth 2 (two) times daily for 7 days. 09/05/23 09/12/23 Yes Meliton Rattan, PA  ipratropium (ATROVENT) 0.03 % nasal spray Place 2 sprays into both nostrils 3 (three) times daily for 7 days. 09/05/23 10/25/23 Yes Meliton Rattan, PA  albuterol (VENTOLIN HFA) 108 (90 Base) MCG/ACT inhaler Inhale 2 puffs into the lungs every 6 (six) hours as needed for wheezing or shortness of breath. Patient not taking: Reported on 08/01/2023 02/13/23   Joselyn Arrow, MD  amLODipine (NORVASC) 10 MG tablet Take 1 tablet (10 mg total) by mouth daily. 08/01/23   Joselyn Arrow, MD  cholecalciferol (VITAMIN D3) 25 MCG (1000 UNIT) tablet Take 1,000 Units by mouth daily.    [provider]  fluticasone (FLONASE) 50 MCG/ACT nasal spray Place 2 sprays into both nostrils daily. Patient not taking: Reported on 08/01/2023 02/13/23   Joselyn Arrow, MD  montelukast (SINGULAIR) 10 MG tablet Take 1 tablet (10 mg total) by mouth at  bedtime. 06/21/23   Joselyn Arrow, MD  Multiple Vitamins-Minerals (CENTRUM SILVER PO) Take 1 tablet by mouth daily.    [provider]  olmesartan-hydrochlorothiazide (BENICAR HCT) 40-25 MG tablet Take 1 tablet by mouth daily. 06/21/23 09/20/23  Joselyn Arrow, MD    Family History Family History  Problem Relation Age of Onset   Hypertension Father    Congestive Heart Failure Father    Heart disease Father    Cancer Mother 17       colon cancer   Diabetes Mother    Colon cancer Mother 46   Arthritis Sister        rheumatoid arthritis   Hypertension Sister    Arthritis Brother        rheumatoid arthritis    Social History Social History   Tobacco Use   Smoking status: Former    Current packs/day: 0.00    Types: Cigarettes    Quit date: 12/12/1991    Years since quitting: 31.7   Smokeless tobacco:  Never  Vaping Use   Vaping status: Never Used  Substance Use Topics   Alcohol use: No    Alcohol/week: 0.0 standard drinks of alcohol    Comment: sip of wine at Christmas   Drug use: No     Allergies   Codeine, Penicillins, and Sinus & allergy [pseudoephedrine]   Review of Systems Review of Systems  Constitutional:  Positive for fatigue. Negative for appetite change, chills and fever.  HENT:  Positive for ear pain (fullness), postnasal drip, rhinorrhea and sinus pressure. Negative for sinus pain, sore throat and trouble swallowing.   Respiratory:  Positive for cough.   Cardiovascular:  Negative for chest pain.  Gastrointestinal:  Negative for diarrhea, nausea and vomiting.  Neurological:  Negative for headaches.     Physical Exam Triage Vital Signs ED Triage Vitals [09/05/23 1638]  Encounter Vitals Group     BP (!) 156/77     Systolic BP Percentile      Diastolic BP Percentile      Pulse Rate 95     Resp 16     Temp 98.2 F (36.8 C)     Temp Source Oral     SpO2 98 %     Weight      Height      Head Circumference      Peak Flow      Pain Score      Pain Loc      Pain Education      Exclude from Growth Chart    No data found.  Updated Vital Signs BP (!) 156/77 (BP Location: Left Arm)   Pulse 95   Temp 98.2 F (36.8 C) (Oral)   Resp 16   SpO2 98%   Visual Acuity Right Eye Distance:   Left Eye Distance:   Bilateral Distance:    Right Eye Near:   Left Eye Near:    Bilateral Near:     Physical Exam Vitals and nursing note reviewed.  Constitutional:      Appearance: She is not ill-appearing.  HENT:     Head: Normocephalic and atraumatic.     Right Ear: Tympanic membrane and ear canal normal.     Left Ear: Tympanic membrane and ear canal normal.     Nose: Congestion present. No rhinorrhea.     Right Sinus: No maxillary sinus tenderness or frontal sinus tenderness.     Left Sinus: No maxillary sinus tenderness or frontal sinus tenderness.  Mouth/Throat:     Mouth: Mucous membranes are moist.     Pharynx: Oropharynx is clear. No oropharyngeal exudate or posterior oropharyngeal erythema.  Eyes:     General:        Right eye: No discharge.        Left eye: No discharge.     Conjunctiva/sclera: Conjunctivae normal.  Cardiovascular:     Rate and Rhythm: Normal rate.  Pulmonary:     Effort: Pulmonary effort is normal. No respiratory distress.     Breath sounds: No wheezing, rhonchi or rales.  Musculoskeletal:     Cervical back: Neck supple.  Lymphadenopathy:     Cervical: No cervical adenopathy.  Skin:    General: Skin is warm.  Neurological:     Mental Status: She is alert and oriented to person, place, and time.      UC Treatments / Results  Labs (all labs ordered are listed, but only abnormal results are displayed) Labs Reviewed - No data to display  EKG   Radiology No results found.  Procedures Procedures (including critical care time)  Medications Ordered in UC Medications - No data to display  Initial Impression / Assessment and Plan / UC Course  I have reviewed the triage vital signs and the nursing notes.  Pertinent labs & imaging results that were available during my care of the patient were reviewed by me and considered in my medical decision making (see chart for details).     URI symptoms, doubt sinusitis, recommend Rx ipratropium nasal spray, if worsening symptoms or fever can take Rx   Final Clinical Impressions(s) / UC Diagnoses   Final diagnoses:  Viral upper respiratory tract infection   Discharge Instructions   None    ED Prescriptions     Medication Sig Dispense Auth. Provider   ipratropium (ATROVENT) 0.03 % nasal spray Place 2 sprays into both nostrils 3 (three) times daily for 7 days. 30 mL Meliton Rattan, PA   doxycycline (VIBRAMYCIN) 100 MG capsule Take 1 capsule (100 mg total) by mouth 2 (two) times daily for 7 days. 14 capsule Meliton Rattan, [place]      PDMP not  reviewed this encounter.   Meliton Rattan, [place] 09/05/23 1701

## 2023-09-14 ENCOUNTER — Other Ambulatory Visit: Payer: Self-pay | Admitting: Family Medicine

## 2023-09-14 DIAGNOSIS — Z1212 Encounter for screening for malignant neoplasm of rectum: Secondary | ICD-10-CM

## 2023-09-14 DIAGNOSIS — Z1211 Encounter for screening for malignant neoplasm of colon: Secondary | ICD-10-CM

## 2023-09-18 ENCOUNTER — Other Ambulatory Visit (HOSPITAL_COMMUNITY): Payer: Self-pay

## 2023-09-18 ENCOUNTER — Other Ambulatory Visit: Payer: Self-pay | Admitting: Family Medicine

## 2023-09-18 DIAGNOSIS — J45909 Unspecified asthma, uncomplicated: Secondary | ICD-10-CM

## 2023-09-18 DIAGNOSIS — I10 Essential (primary) hypertension: Secondary | ICD-10-CM

## 2023-09-18 MED ORDER — OLMESARTAN MEDOXOMIL-HCTZ 40-25 MG PO TABS
1.0000 | ORAL_TABLET | Freq: Every day | ORAL | 0 refills | Status: DC
  Filled 2023-09-18: qty 90, 90d supply, fill #0

## 2023-09-18 MED ORDER — MONTELUKAST SODIUM 10 MG PO TABS
10.0000 mg | ORAL_TABLET | Freq: Every day | ORAL | 0 refills | Status: DC
  Filled 2023-09-18: qty 90, 90d supply, fill #0

## 2023-10-02 ENCOUNTER — Other Ambulatory Visit (HOSPITAL_COMMUNITY): Payer: Self-pay

## 2023-10-25 ENCOUNTER — Other Ambulatory Visit (HOSPITAL_COMMUNITY): Payer: Self-pay

## 2023-10-26 ENCOUNTER — Encounter (HOSPITAL_COMMUNITY): Payer: Self-pay | Admitting: Emergency Medicine

## 2023-10-26 ENCOUNTER — Ambulatory Visit (HOSPITAL_COMMUNITY)
Admission: EM | Admit: 2023-10-26 | Discharge: 2023-10-26 | Disposition: A | Discharge status: Home / Self Care | Payer: Commercial Managed Care - PPO | Attending: Emergency Medicine | Admitting: Emergency Medicine

## 2023-10-26 ENCOUNTER — Other Ambulatory Visit (HOSPITAL_COMMUNITY): Payer: Self-pay

## 2023-10-26 DIAGNOSIS — K2900 Acute gastritis without bleeding: Secondary | ICD-10-CM | POA: Diagnosis not present

## 2023-10-26 DIAGNOSIS — K5900 Constipation, unspecified: Secondary | ICD-10-CM

## 2023-10-26 MED ORDER — LIDOCAINE VISCOUS HCL 2 % MT SOLN
OROMUCOSAL | Status: AC
  Filled 2023-10-26: qty 15

## 2023-10-26 MED ORDER — DOCUSATE SODIUM 100 MG PO CAPS
100.0000 mg | ORAL_CAPSULE | Freq: Two times a day (BID) | ORAL | 0 refills | Status: AC
  Filled 2023-10-26: qty 60, 30d supply, fill #0

## 2023-10-26 MED ORDER — ALUM & MAG HYDROXIDE-SIMETH 200-200-20 MG/5ML PO SUSP
ORAL | Status: AC
  Filled 2023-10-26: qty 30

## 2023-10-26 MED ORDER — METOCLOPRAMIDE HCL 10 MG PO TABS
10.0000 mg | ORAL_TABLET | Freq: Three times a day (TID) | ORAL | 0 refills | Status: DC
  Filled 2023-10-26: qty 30, 10d supply, fill #0

## 2023-10-26 MED ORDER — LIDOCAINE VISCOUS HCL 2 % MT SOLN
15.0000 mL | Freq: Once | OROMUCOSAL | Status: AC
  Administered 2023-10-26: 15 mL via OROMUCOSAL

## 2023-10-26 MED ORDER — ALUM & MAG HYDROXIDE-SIMETH 200-200-20 MG/5ML PO SUSP
30.0000 mL | Freq: Once | ORAL | Status: AC
  Administered 2023-10-26: 30 mL via ORAL

## 2023-10-26 NOTE — Discharge Instructions (Addendum)
We have given you a GI cocktail to help coat your stomach today in clinic.  You can restart your Pepcid or Protonix at home.  Take the Reglan prior to meals.  Avoid foods high in acidity and that require heavy chewing, as with your dentures this will introduce more air causing more gas and bloating.  Ensure you are drinking plenty of water and staying physically active, this will help move your bowels.  You can take the Colace which is a stool softener to help soften your stool.  You can consider an over-the-counter fiber gummy as well.   If your symptoms persist, please follow-up with your primary care provider.  Return to clinic for any new or urgent symptoms.

## 2023-10-26 NOTE — ED Triage Notes (Signed)
Pt reports on Tuesday she reached up on her high door and started having epigastric pain and lots of "gas, both ends". Took some Fazon (? Spelling). Reports having small BMs, so little constipated.

## 2023-10-26 NOTE — ED Provider Notes (Signed)
[facility]    CSN: [id number] Arrival date & time: 10/26/23  0831      History   Chief Complaint Chief Complaint  Patient presents with   Abdominal Pain   Gas    e    HPI Natasha Bauer is a 67 y.o. female.   Patient presents to clinic complaining of epigastric discomfort, burping and excess gas.  Reports she was reaching up for something when she felt the need to burp.  She does have dentures so she has a hard time chewing her food.  She has also been having small hard bowel movements.  Her last bowel movement was this morning and she had to strain.  She denies any blood in the toilet or in her stool.  She was seen previously for similar epigastric pain and was given Reglan and a GI cocktail, which helped greatly.  She did take Protonix daily for about a month, but stopped because she was feeling better.     The history is provided by the patient and medical records.  Abdominal Pain   Past Medical History:  Diagnosis Date   Asthma    Colon polyp    hyperplastic in 2004 and 2013    Hypertension age 74   Seasonal allergies    Tenosynovitis, de Quervain    Dr. Amanda Pea, resolved    Patient Active Problem List   Diagnosis Date Noted   Mass of left submandibular region 07/31/2023   Vitamin D deficiency 07/31/2023   Impaired fasting glucose 07/31/2023   Family history of colon cancer in mother 09/29/2018   Class 3 severe obesity due to excess calories with serious comorbidity and body mass index (BMI) of 40.0 to 44.9 in adult (HCC) 05/27/2015   Right thyroid nodule 05/22/2014   Obesity, Class II, BMI 35-39.9 04/30/2013   Essential hypertension, benign 07/31/2012   Asthma 07/31/2012   Allergic rhinitis 07/31/2012    Past Surgical History:  Procedure Laterality Date   BREAST BIOPSY  08/09/2020   COLONOSCOPY  08/20/12   Dr. Elnoria Howard (hyperplastic polyp)   LACERATION REPAIR Right 10/16   cut finger   TOTAL ABDOMINAL HYSTERECTOMY W/ BILATERAL  SALPINGOOPHORECTOMY  05/2003   fibroids    OB History     Gravida  0   Para  0   Term  0   Preterm  0   AB  0   Living  0      SAB  0   IAB  0   Ectopic  0   Multiple  0   Live Births               Home Medications    Prior to Admission medications   Medication Sig Start Date End Date Taking? Authorizing Provider  docusate sodium (COLACE) 100 MG capsule Take 1 capsule (100 mg total) by mouth every 12 (twelve) hours. 10/26/23  Yes Rinaldo Ratel, [place] N, FNP  metoCLOPramide (REGLAN) 10 MG tablet Take 1 tablet (10 mg total) by mouth 3 (three) times daily before meals. 10/26/23  Yes Rinaldo Ratel, [place] N, FNP  albuterol (VENTOLIN HFA) 108 (90 Base) MCG/ACT inhaler Inhale 2 puffs into the lungs every 6 (six) hours as needed for wheezing or shortness of breath. Patient not taking: Reported on 08/01/2023 02/13/23   Joselyn Arrow, MD  amLODipine (NORVASC) 10 MG tablet Take 1 tablet (10 mg total) by mouth daily. 08/01/23   Joselyn Arrow, MD  cholecalciferol (VITAMIN D3) 25 MCG (1000 UNIT) tablet  Take 1,000 Units by mouth daily.    [provider]  doxycycline (VIBRAMYCIN) 100 MG capsule Take 1 capsule (100 mg total) by mouth 2 (two) times daily for 7 days 09/05/23   Meliton Rattan, PA  fluticasone (FLONASE) 50 MCG/ACT nasal spray Place 2 sprays into both nostrils daily. Patient not taking: Reported on 08/01/2023 02/13/23   Joselyn Arrow, MD  ipratropium (ATROVENT) 0.03 % nasal spray Place 2 sprays into both nostrils 3 (three) times daily for 7 days. 09/05/23 09/30/23  Meliton Rattan, PA  montelukast (SINGULAIR) 10 MG tablet Take 1 tablet (10 mg total) by mouth at bedtime. 09/18/23   Joselyn Arrow, MD  Multiple Vitamins-Minerals (CENTRUM SILVER PO) Take 1 tablet by mouth daily.    [provider]  olmesartan-hydrochlorothiazide (BENICAR HCT) 40-25 MG tablet Take 1 tablet by mouth daily. 09/18/23 12/17/23  Joselyn Arrow, MD    Family History Family History  Problem Relation Age of  Onset   Hypertension Father    Congestive Heart Failure Father    Heart disease Father    Cancer Mother 11       colon cancer   Diabetes Mother    Colon cancer Mother 83   Arthritis Sister        rheumatoid arthritis   Hypertension Sister    Arthritis Brother        rheumatoid arthritis    Social History Social History   Tobacco Use   Smoking status: Former    Current packs/day: 0.00    Types: Cigarettes    Quit date: 12/12/1991    Years since quitting: 31.8   Smokeless tobacco: Never  Vaping Use   Vaping status: Never Used  Substance Use Topics   Alcohol use: No    Alcohol/week: 0.0 standard drinks of alcohol    Comment: sip of wine at Christmas   Drug use: No     Allergies   Codeine, Penicillins, and Sinus & allergy [pseudoephedrine]   Review of Systems Review of Systems   Physical Exam Triage Vital Signs ED Triage Vitals  Encounter Vitals Group     BP 10/26/23 0936 (!) 147/87     Systolic BP Percentile --      Diastolic BP Percentile --      Pulse Rate 10/26/23 0936 97     Resp 10/26/23 0936 17     Temp 10/26/23 0936 98 F (36.7 C)     Temp Source 10/26/23 0936 Oral     SpO2 10/26/23 0936 96 %     Weight --      Height --      Head Circumference --      Peak Flow --      Pain Score 10/26/23 0934 4     Pain Loc --      Pain Education --      Exclude from Growth Chart --    No data found.  Updated Vital Signs BP (!) 147/87 (BP Location: Left Arm)   Pulse 97   Temp 98 F (36.7 C) (Oral)   Resp 17   SpO2 96%   Visual Acuity Right Eye Distance:   Left Eye Distance:   Bilateral Distance:    Right Eye Near:   Left Eye Near:    Bilateral Near:     Physical Exam Vitals and nursing note reviewed.  Constitutional:      Appearance: Normal appearance. She is well-developed.  HENT:     Head: Normocephalic and atraumatic.  Right Ear: External ear normal.     Left Ear: External ear normal.     Nose: Nose normal.     Mouth/Throat:      Mouth: Mucous membranes are moist.  Cardiovascular:     Rate and Rhythm: Normal rate and regular rhythm.     Heart sounds: Normal heart sounds. No murmur heard. Pulmonary:     Effort: Pulmonary effort is normal. No respiratory distress.     Breath sounds: Normal breath sounds.  Abdominal:     General: Abdomen is flat. Bowel sounds are normal.     Palpations: Abdomen is soft.     Tenderness: There is no abdominal tenderness.  Musculoskeletal:        General: Normal range of motion.  Skin:    General: Skin is warm and dry.  Neurological:     General: No focal deficit present.     Mental Status: She is alert.  Psychiatric:        Mood and Affect: Mood normal.      UC Treatments / Results  Labs (all labs ordered are listed, but only abnormal results are displayed) Labs Reviewed - No data to display  EKG   Radiology No results found.  Procedures Procedures (including critical care time)  Medications Ordered in UC Medications  alum & mag hydroxide-simeth (MAALOX/MYLANTA) 200-200-20 MG/5ML suspension 30 mL (30 mLs Oral Given 10/26/23 0951)  lidocaine (XYLOCAINE) 2 % viscous mouth solution 15 mL (15 mLs Mouth/Throat Given 10/26/23 0951)    Initial Impression / Assessment and Plan / UC Course  I have reviewed the triage vital signs and the nursing notes.  Pertinent labs & imaging results that were available during my care of the patient were reviewed by me and considered in my medical decision making (see chart for details).  Vitals and triage reviewed, patient is hemodynamically stable.  Lungs are vesicular, heart with regular rate and rhythm.  Abdomen is soft and nontender with active bowel sounds.  No chest pain or shortness of breath.  Symptoms consistent with constipation and acid reflux versus gastritis.  GI cocktail given in clinic.  Low acidic diet discussed.  Will trial Reglan and Protonix.  Colace to soften the stool.  Without any red flag symptoms on physical exam.   Encourage PCP follow-up if symptoms persist.  Plan of care, follow-up care return precautions given, no questions at this time.     Final Clinical Impressions(s) / UC Diagnoses   Final diagnoses:  Acute gastritis without hemorrhage, unspecified gastritis type  Constipation, unspecified constipation type     Discharge Instructions      We have given you a GI cocktail to help coat your stomach today in clinic.  You can restart your Pepcid or Protonix at home.  Take the Reglan prior to meals.  Avoid foods high in acidity and that require heavy chewing, as with your dentures this will introduce more air causing more gas and bloating.  Ensure you are drinking plenty of water and staying physically active, this will help move your bowels.  You can take the Colace which is a stool softener to help soften your stool.  You can consider an over-the-counter fiber gummy as well.   If your symptoms persist, please follow-up with your primary care provider.  Return to clinic for any new or urgent symptoms.      ED Prescriptions     Medication Sig Dispense Auth. Provider   docusate sodium (COLACE) 100 MG capsule Take  1 capsule (100 mg total) by mouth every 12 (twelve) hours. 60 capsule Rinaldo Ratel, [place] N, [place]   metoCLOPramide (REGLAN) 10 MG tablet Take 1 tablet (10 mg total) by mouth 3 (three) times daily before meals. 30 tablet Veda Arrellano, [place] N, [place]      PDMP not reviewed this encounter.   Rennie Hack, [place] N, [place] 10/26/23 1001

## 2023-12-06 ENCOUNTER — Other Ambulatory Visit (HOSPITAL_COMMUNITY): Payer: Self-pay

## 2023-12-06 ENCOUNTER — Ambulatory Visit (HOSPITAL_COMMUNITY)
Admission: EM | Admit: 2023-12-06 | Discharge: 2023-12-06 | Disposition: A | Discharge status: Home / Self Care | Payer: Commercial Managed Care - PPO | Attending: Internal Medicine | Admitting: Internal Medicine

## 2023-12-06 ENCOUNTER — Encounter (HOSPITAL_COMMUNITY): Payer: Self-pay

## 2023-12-06 DIAGNOSIS — B9789 Other viral agents as the cause of diseases classified elsewhere: Secondary | ICD-10-CM

## 2023-12-06 DIAGNOSIS — J019 Acute sinusitis, unspecified: Secondary | ICD-10-CM

## 2023-12-06 MED ORDER — IPRATROPIUM BROMIDE 0.03 % NA SOLN
2.0000 | Freq: Two times a day (BID) | NASAL | 0 refills | Status: DC
  Filled 2023-12-06: qty 30, 43d supply, fill #0

## 2023-12-06 MED ORDER — GUAIFENESIN ER 600 MG PO TB12
600.0000 mg | ORAL_TABLET | Freq: Two times a day (BID) | ORAL | 0 refills | Status: DC
  Filled 2023-12-06: qty 20, 10d supply, fill #0

## 2023-12-06 MED ORDER — BENZONATATE 200 MG PO CAPS
200.0000 mg | ORAL_CAPSULE | Freq: Three times a day (TID) | ORAL | 0 refills | Status: DC | PRN
  Filled 2023-12-06: qty 21, 7d supply, fill #0

## 2023-12-06 MED ORDER — PREDNISONE 20 MG PO TABS
40.0000 mg | ORAL_TABLET | Freq: Every day | ORAL | 0 refills | Status: AC
  Filled 2023-12-06: qty 10, 5d supply, fill #0

## 2023-12-06 NOTE — ED Provider Notes (Signed)
[facility]    CSN: [id number] Arrival date & time: 12/06/23  0805      History   Chief Complaint Chief Complaint  Patient presents with   Sore Throat    HPI Natasha Bauer is a 67 y.o. female comes to the urgent care with cc history of nasal congestion, greenish nasal discharge, cough and hoarseness of voice.  Patient's symptoms started insidiously and has been persistent.  She had a sore throat at the outset of her symptoms.  Sore throat is better at this time.  Patient used Flonase on 1 occasion and stopped using it.  She is not using a humidifier.  No fever or chills.  No shortness of breath or wheezing.  No chest pain or chest pressure.  No sick contacts.  Patient denies any facial pain or headaches.  Patient has tried Mucinex with no improvement in his symptoms. HPI  Past Medical History:  Diagnosis Date   Asthma    Colon polyp    hyperplastic in 2004 and 2013    Hypertension age 89   Seasonal allergies    Tenosynovitis, de Quervain    Dr. Amanda Pea, resolved    Patient Active Problem List   Diagnosis Date Noted   Mass of left submandibular region 07/31/2023   Vitamin D deficiency 07/31/2023   Impaired fasting glucose 07/31/2023   Family history of colon cancer in mother 09/29/2018   Class 3 severe obesity due to excess calories with serious comorbidity and body mass index (BMI) of 40.0 to 44.9 in adult [facility]) 05/27/2015   Right thyroid nodule 05/22/2014   Obesity, Class II, BMI 35-39.9 04/30/2013   Essential hypertension, benign 07/31/2012   Asthma 07/31/2012   Allergic rhinitis 07/31/2012    Past Surgical History:  Procedure Laterality Date   BREAST BIOPSY  08/09/2020   COLONOSCOPY  08/20/12   Dr. Elnoria Howard (hyperplastic polyp)   LACERATION REPAIR Right 10/16   cut finger   TOTAL ABDOMINAL HYSTERECTOMY W/ BILATERAL SALPINGOOPHORECTOMY  05/2003   fibroids    OB History     Gravida  0   Para  0   Term  0   Preterm  0   AB  0   Living   0      SAB  0   IAB  0   Ectopic  0   Multiple  0   Live Births               Home Medications    Prior to Admission medications   Medication Sig Start Date End Date Taking? Authorizing Provider  amLODipine (NORVASC) 10 MG tablet Take 1 tablet (10 mg total) by mouth daily. 08/01/23  Yes Joselyn Arrow, MD  benzonatate (TESSALON) 200 MG capsule Take 1 capsule (200 mg total) by mouth 3 (three) times daily as needed for cough. 12/06/23  Yes Aurorah Schlachter, Britta Mccreedy, MD  cholecalciferol (VITAMIN D3) 25 MCG (1000 UNIT) tablet Take 1,000 Units by mouth daily.   Yes [provider]  docusate sodium (COLACE) 100 MG capsule Take 1 capsule (100 mg total) by mouth every 12 (twelve) hours. 10/26/23  Yes Rinaldo Ratel, [place] N, FNP  fluticasone [facility]) 50 MCG/ACT nasal spray Place 2 sprays into both nostrils daily. 02/13/23  Yes Joselyn Arrow, MD  guaiFENesin (MUCINEX) 600 MG 12 hr tablet Take 1 tablet (600 mg total) by mouth 2 (two) times daily. 12/06/23  Yes Gaddiel Cullens, Britta Mccreedy, MD  ipratropium (ATROVENT) 0.03 % nasal spray Place 2  sprays into both nostrils every 12 (twelve) hours. 12/06/23  Yes Raylynne Cubbage, Britta Mccreedy, MD  montelukast (SINGULAIR) 10 MG tablet Take 1 tablet (10 mg total) by mouth at bedtime. 09/18/23  Yes Joselyn Arrow, MD  Multiple Vitamins-Minerals (CENTRUM SILVER PO) Take 1 tablet by mouth daily.   Yes [provider]  olmesartan-hydrochlorothiazide (BENICAR HCT) 40-25 MG tablet Take 1 tablet by mouth daily. 09/18/23 12/17/23 Yes Joselyn Arrow, MD  predniSONE (DELTASONE) 20 MG tablet Take 2 tablets (40 mg total) by mouth daily for 5 days. 12/06/23 12/11/23 Yes Markell Schrier, Britta Mccreedy, MD    Family History Family History  Problem Relation Age of Onset   Hypertension Father    Congestive Heart Failure Father    Heart disease Father    Cancer Mother 67       colon cancer   Diabetes Mother    Colon cancer Mother 51   Arthritis Sister        rheumatoid arthritis   Hypertension Sister     Arthritis Brother        rheumatoid arthritis    Social History Social History   Tobacco Use   Smoking status: Former    Current packs/day: 0.00    Types: Cigarettes    Quit date: 12/12/1991    Years since quitting: 32.0   Smokeless tobacco: Never  Vaping Use   Vaping status: Never Used  Substance Use Topics   Alcohol use: No    Alcohol/week: 0.0 standard drinks of alcohol    Comment: sip of wine at Christmas   Drug use: No     Allergies   Codeine, Penicillins, and Sinus & allergy [pseudoephedrine]   Review of Systems Review of Systems As per HPI  Physical Exam Triage Vital Signs ED Triage Vitals  Encounter Vitals Group     BP 12/06/23 0828 (!) 167/97     Systolic BP Percentile --      Diastolic BP Percentile --      Pulse Rate 12/06/23 0828 92     Resp 12/06/23 0828 18     Temp 12/06/23 0828 98.3 F (36.8 C)     Temp Source 12/06/23 0828 Oral     SpO2 12/06/23 0828 95 %     Weight 12/06/23 0827 214 lb 8.1 oz (97.3 kg)     Height 12/06/23 0827 5\' 1"  (1.549 m)     Head Circumference --      Peak Flow --      Pain Score 12/06/23 0826 6     Pain Loc --      Pain Education --      Exclude from Growth Chart --    No data found.  Updated Vital Signs BP (!) 167/97 (BP Location: Left Wrist) Comment: checked twice both arms  Pulse 92   Temp 98.3 F (36.8 C) (Oral)   Resp 18   Ht 5\' 1"  (1.549 m)   Wt 97.3 kg   SpO2 95%   BMI 40.53 kg/m   Visual Acuity Right Eye Distance:   Left Eye Distance:   Bilateral Distance:    Right Eye Near:   Left Eye Near:    Bilateral Near:     Physical Exam Vitals and nursing note reviewed.  Constitutional:      General: She is not in acute distress.    Appearance: She is not ill-appearing.  HENT:     Right Ear: Tympanic membrane normal.     Left Ear: Tympanic membrane normal.  Ears:     Comments: Bilateral middle ear effusions    Mouth/Throat:     Mouth: Mucous membranes are moist. No oral lesions.      Pharynx: No pharyngeal swelling.     Tonsils: No tonsillar exudate or tonsillar abscesses. 1+ on the right. 1+ on the left.  Cardiovascular:     Rate and Rhythm: Normal rate and regular rhythm.     Heart sounds: Normal heart sounds.  Pulmonary:     Effort: Pulmonary effort is normal.     Breath sounds: Normal breath sounds.  Neurological:     Mental Status: She is alert.      UC Treatments / Results  Labs (all labs ordered are listed, but only abnormal results are displayed) Labs Reviewed - No data to display  EKG   Radiology No results found.  Procedures Procedures (including critical care time)  Medications Ordered in UC Medications - No data to display  Initial Impression / Assessment and Plan / UC Course  I have reviewed the triage vital signs and the nursing notes.  Pertinent labs & imaging results that were available during my care of the patient were reviewed by me and considered in my medical decision making (see chart for details).     1.  Acute viral sinusitis: Tessalon Perles as needed for cough Ipratropium nasal spray twice daily as needed Prednisone 40 mg orally daily for 5 days Mucinex 600 mg twice daily Humidifier and VapoRub use recommended Return precautions given. Final Clinical Impressions(s) / UC Diagnoses   Final diagnoses:  Acute viral sinusitis     Discharge Instructions      Humidifier VapoRub use to help with nasal congestion, postnasal drainage and cough Please take medications as directed Maintain adequate hydration You may take Tylenol or ibuprofen as needed for pain and/or fever Please feel free to return to urgent care if you have worsening symptoms.   ED Prescriptions     Medication Sig Dispense Auth. Provider   benzonatate (TESSALON) 200 MG capsule Take 1 capsule (200 mg total) by mouth 3 (three) times daily as needed for cough. 21 capsule Jenni Thew, Britta Mccreedy, MD   ipratropium (ATROVENT) 0.03 % nasal spray Place 2 sprays  into both nostrils every 12 (twelve) hours. 30 mL Grantham Hippert, Britta Mccreedy, MD   predniSONE (DELTASONE) 20 MG tablet Take 2 tablets (40 mg total) by mouth daily for 5 days. 10 tablet Ioannis Schuh, Britta Mccreedy, MD   guaiFENesin (MUCINEX) 600 MG 12 hr tablet Take 1 tablet (600 mg total) by mouth 2 (two) times daily. 20 tablet Solimar Maiden, Britta Mccreedy, MD      PDMP not reviewed this encounter.   Merrilee Jansky, MD 12/06/23 [phone]

## 2023-12-06 NOTE — ED Triage Notes (Signed)
Sore Throat, loss of voice, congestion, cough that is productive X6 Days. No known sick exposure.  Patient tried Mucinex, tylenol, Robitussin with mild relief.

## 2023-12-06 NOTE — Discharge Instructions (Addendum)
Humidifier VapoRub use to help with nasal congestion, postnasal drainage and cough Please take medications as directed Maintain adequate hydration You may take Tylenol or ibuprofen as needed for pain and/or fever Please feel free to return to urgent care if you have worsening symptoms.

## 2023-12-11 ENCOUNTER — Encounter (HOSPITAL_COMMUNITY): Payer: Self-pay

## 2023-12-11 ENCOUNTER — Other Ambulatory Visit (HOSPITAL_COMMUNITY): Payer: Self-pay

## 2023-12-11 ENCOUNTER — Ambulatory Visit (HOSPITAL_COMMUNITY)
Admission: EM | Admit: 2023-12-11 | Discharge: 2023-12-11 | Disposition: A | Discharge status: Home / Self Care | Payer: Commercial Managed Care - PPO | Attending: Internal Medicine | Admitting: Internal Medicine

## 2023-12-11 DIAGNOSIS — J329 Chronic sinusitis, unspecified: Secondary | ICD-10-CM | POA: Diagnosis not present

## 2023-12-11 MED ORDER — LEVOFLOXACIN 500 MG PO TABS
500.0000 mg | ORAL_TABLET | Freq: Every day | ORAL | 0 refills | Status: AC
  Filled 2023-12-11: qty 7, 7d supply, fill #0

## 2023-12-11 NOTE — ED Triage Notes (Signed)
Pt states sinus pressure and cough for the past 10 days.  States she was seen here last week and finished a round of prednisone with no relief.

## 2023-12-11 NOTE — ED Provider Notes (Signed)
 [facility]    CSN: [id number] Arrival date & time: 12/11/23  1144      History   Chief Complaint Chief Complaint  Patient presents with   Cough    HPI Natasha Bauer  is a 67 y.o. female presents with unresolved sinus pressure and cough x 10 days. Was seen for viral sinusitis last week and placed on Prednisone  and Tessalon , but has not helped. Is alleviated the stuffiness, but the purulent discharge on the back of her throat has continued and now has a smell. Denies hx of sinus surgeries. Has not been doing saline rinses.  Denies fever and her cough is not worse. Has not been sweating.    Past Medical History:  Diagnosis Date   Asthma    Colon polyp    hyperplastic in 2004 and 2013    Hypertension age 4   Seasonal allergies    Tenosynovitis, de Quervain    Dr. Camella, resolved    Patient Active Problem List   Diagnosis Date Noted   Mass of left submandibular region 07/31/2023   Vitamin D  deficiency 07/31/2023   Impaired fasting glucose 07/31/2023   Family history of colon cancer in mother 09/29/2018   Class 3 severe obesity due to excess calories with serious comorbidity and body mass index (BMI) of 40.0 to 44.9 in adult (HCC) 05/27/2015   Right thyroid  nodule 05/22/2014   Obesity, Class II, BMI 35-39.9 04/30/2013   Essential hypertension, benign 07/31/2012   Asthma 07/31/2012   Allergic rhinitis 07/31/2012    Past Surgical History:  Procedure Laterality Date   BREAST BIOPSY  08/09/2020   COLONOSCOPY  08/20/12   Dr. Rollin (hyperplastic polyp)   LACERATION REPAIR Right 10/16   cut finger   TOTAL ABDOMINAL HYSTERECTOMY W/ BILATERAL SALPINGOOPHORECTOMY  05/2003   fibroids    OB History     Gravida  0   Para  0   Term  0   Preterm  0   AB  0   Living  0      SAB  0   IAB  0   Ectopic  0   Multiple  0   Live Births               Home Medications    Prior to Admission medications   Medication Sig Start Date End Date  Taking? Authorizing Provider  amLODipine  (NORVASC ) 10 MG tablet Take 1 tablet (10 mg total) by mouth daily. 08/01/23   Randol Dawes, MD  benzonatate  (TESSALON ) 200 MG capsule Take 1 capsule (200 mg total) by mouth 3 (three) times daily as needed for cough. 12/06/23   LampteyAleene KIDD, MD  cholecalciferol (VITAMIN D3) 25 MCG (1000 UNIT) tablet Take 1,000 Units by mouth daily.    [provider]  docusate sodium  (COLACE) 100 MG capsule Take 1 capsule (100 mg total) by mouth every 12 (twelve) hours. 10/26/23   Dreama, [place]  N, FNP  fluticasone  (FLONASE ) 50 MCG/ACT nasal spray Place 2 sprays into both nostrils daily. 02/13/23   Randol Dawes, MD  guaiFENesin  (MUCINEX ) 600 MG 12 hr tablet Take 1 tablet (600 mg total) by mouth 2 (two) times daily. 12/06/23   Blaise Aleene KIDD, MD  ipratropium (ATROVENT ) 0.03 % nasal spray Place 2 sprays into both nostrils every 12 (twelve) hours. 12/06/23   LampteyAleene KIDD, MD  montelukast  (SINGULAIR ) 10 MG tablet Take 1 tablet (10 mg total) by mouth at bedtime. 09/18/23   Randol Dawes,  MD  Multiple Vitamins-Minerals (CENTRUM SILVER PO) Take 1 tablet by mouth daily.    [provider]  olmesartan -hydrochlorothiazide  (BENICAR  HCT) 40-25 MG tablet Take 1 tablet by mouth daily. 09/18/23 12/17/23  Randol Dawes, MD  predniSONE  (DELTASONE ) 20 MG tablet Take 2 tablets (40 mg total) by mouth daily for 5 days. 12/06/23 12/11/23  Blaise Aleene KIDD, MD    Family History Family History  Problem Relation Age of Onset   Hypertension Father    Congestive Heart Failure Father    Heart disease Father    Cancer Mother 45       colon cancer   Diabetes Mother    Colon cancer Mother 75   Arthritis Sister        rheumatoid arthritis   Hypertension Sister    Arthritis Brother        rheumatoid arthritis    Social History Social History   Tobacco Use   Smoking status: Former    Current packs/day: 0.00    Types: Cigarettes    Quit date: 12/12/1991    Years since  quitting: 32.0   Smokeless tobacco: Never  Vaping Use   Vaping status: Never Used  Substance Use Topics   Alcohol use: No    Alcohol/week: 0.0 standard drinks of alcohol    Comment: sip of wine at Christmas   Drug use: No     Allergies   Codeine, Penicillins, and Sinus & allergy [pseudoephedrine]   Review of Systems Review of Systems As noted in HPI  Physical Exam Triage Vital Signs ED Triage Vitals  Encounter Vitals Group     BP 12/11/23 1314 (!) 165/78     Systolic BP Percentile --      Diastolic BP Percentile --      Pulse Rate 12/11/23 1313 82     Resp 12/11/23 1313 16     Temp 12/11/23 1313 98.3 F (36.8 C)     Temp Source 12/11/23 1313 Oral     SpO2 12/11/23 1313 95 %     Weight --      Height --      Head Circumference --      Peak Flow --      Pain Score 12/11/23 1314 0     Pain Loc --      Pain Education --      Exclude from Growth Chart --    No data found.  Updated Vital Signs BP (!) 165/78 (BP Location: Left Arm)   Pulse 82   Temp 98.3 F (36.8 C) (Oral)   Resp 16   SpO2 95%  Pulse ox repeated without mask 97% Visual Acuity Right Eye Distance:   Left Eye Distance:   Bilateral Distance:    Right Eye Near:   Left Eye Near:    Bilateral Near:     Physical Exam Physical Exam Vitals signs and nursing note reviewed.  Constitutional:      General: She is not in acute distress.    Appearance: Normal appearance. She is not ill-appearing, toxic-appearing or diaphoretic.  HENT:     Head: Normocephalic.     Right Ear: Tympanic membrane, ear canal and external ear normal.     Left Ear: Tympanic membrane, ear canal and external ear normal.     Nose: pink pale mucosa with green mucous on each side    Mouth/Throat: clear    Mouth: Mucous membranes are moist.  Eyes:     General: No scleral icterus.  Right eye: No discharge.        Left eye: No discharge.     Conjunctiva/sclera: Conjunctivae normal.  Neck:     Musculoskeletal: Neck  supple. No neck rigidity.  Cardiovascular:     Rate and Rhythm: Normal rate and regular rhythm.     Heart sounds: No murmur.  Pulmonary:     Effort: Pulmonary effort is normal.     Breath sounds: Normal breath sounds.  Musculoskeletal: Normal range of motion.  Lymphadenopathy:     Cervical: No cervical adenopathy.  Skin:    General: Skin is warm and dry.     Coloration: Skin is not jaundiced.     Findings: No rash.  Neurological:     Mental Status: She is alert and oriented to person, place, and time.     Gait: Gait normal.  Psychiatric:        Mood and Affect: Mood normal.        Behavior: Behavior normal.        Thought Content: Thought content normal.        Judgment: Judgment normal.    UC Treatments / Results  Labs (all labs ordered are listed, but only abnormal results are displayed) Labs Reviewed - No data to display  EKG   Radiology No results found.  Procedures Procedures (including critical care time)  Medications Ordered in UC Medications - No data to display  Initial Impression / Assessment and Plan / UC Course  I have reviewed the triage vital signs and the nursing notes.  Purulent post nasal drainage  I placed her on Levaquin  and showed her a video how to do saline nose rinses     Final Clinical Impressions(s) / UC Diagnoses   Final diagnoses:  None   Discharge Instructions   None    ED Prescriptions   None    PDMP not reviewed this encounter.   Lindi Carter, [place] 12/11/23 1405

## 2023-12-11 NOTE — Discharge Instructions (Addendum)
 Do sinus rinses with [facility] Med Kit twice a day for 3-7 days until you no longer are stuffy and the mucous has cleared out, do not use tap water only distilled.

## 2023-12-24 ENCOUNTER — Encounter (HOSPITAL_COMMUNITY): Payer: Self-pay

## 2023-12-24 ENCOUNTER — Other Ambulatory Visit: Payer: Self-pay | Admitting: Family Medicine

## 2023-12-24 ENCOUNTER — Other Ambulatory Visit (HOSPITAL_COMMUNITY): Payer: Self-pay

## 2023-12-24 DIAGNOSIS — I10 Essential (primary) hypertension: Secondary | ICD-10-CM

## 2023-12-24 DIAGNOSIS — J45909 Unspecified asthma, uncomplicated: Secondary | ICD-10-CM

## 2023-12-24 MED ORDER — MONTELUKAST SODIUM 10 MG PO TABS
10.0000 mg | ORAL_TABLET | Freq: Every day | ORAL | 0 refills | Status: DC
  Filled 2023-12-24 – 2023-12-25 (×2): qty 90, 90d supply, fill #0

## 2023-12-24 MED ORDER — OLMESARTAN MEDOXOMIL-HCTZ 40-25 MG PO TABS
1.0000 | ORAL_TABLET | Freq: Every day | ORAL | 0 refills | Status: DC
  Filled 2023-12-24 – 2023-12-25 (×2): qty 90, 90d supply, fill #0

## 2023-12-25 ENCOUNTER — Other Ambulatory Visit: Payer: Self-pay

## 2023-12-25 ENCOUNTER — Other Ambulatory Visit (HOSPITAL_COMMUNITY): Payer: Self-pay

## 2024-01-21 ENCOUNTER — Other Ambulatory Visit: Payer: Self-pay | Admitting: Family Medicine

## 2024-01-21 ENCOUNTER — Other Ambulatory Visit (HOSPITAL_COMMUNITY): Payer: Self-pay

## 2024-01-21 DIAGNOSIS — I10 Essential (primary) hypertension: Secondary | ICD-10-CM

## 2024-01-21 MED ORDER — AMLODIPINE BESYLATE 10 MG PO TABS
10.0000 mg | ORAL_TABLET | Freq: Every day | ORAL | 0 refills | Status: DC
  Filled 2024-01-21: qty 90, 90d supply, fill #0

## 2024-02-05 NOTE — Patient Instructions (Incomplete)
 HEALTH MAINTENANCE RECOMMENDATIONS:  It is recommended that you get at least 30 minutes of aerobic exercise at least 5 days/week (for weight loss, you may need as much as 60-90 minutes). This can be any activity that gets your heart rate up. This can be divided in 10-15 minute intervals if needed, but try and build up your endurance at least once a week.  Weight bearing exercise is also recommended twice weekly.  Eat a healthy diet with lots of vegetables, fruits and fiber.  "Colorful" foods have a lot of vitamins (ie green vegetables, tomatoes, red peppers, etc).  Limit sweet tea, regular sodas and alcoholic beverages, all of which has a lot of calories and sugar.  Up to 1 alcoholic drink daily may be beneficial for women (unless trying to lose weight, watch sugars).  Drink a lot of water.  Calcium recommendations are 1200-1500 mg daily (1500 mg for postmenopausal women or women without ovaries), and vitamin D 1000 IU daily.  This should be obtained from diet and/or supplements (vitamins), and calcium should not be taken all at once, but in divided doses.  Monthly self breast exams and yearly mammograms for women over the age of 16 is recommended.  Sunscreen of at least SPF 30 should be used on all sun-exposed parts of the skin when outside between the hours of 10 am and 4 pm (not just when at beach or pool, but even with exercise, golf, tennis, and yard work!)  Use a sunscreen that says "broad spectrum" so it covers both UVA and UVB rays, and make sure to reapply every 1-2 hours.  Remember to change the batteries in your smoke detectors when changing your clock times in the spring and fall. Carbon monoxide detectors are recommended for your home.  Use your seat belt every time you are in a car, and please drive safely and not be distracted with cell phones and texting while driving.    Natasha Bauer , Thank you for taking time to come for your Welcome to Medicare Visit. I appreciate your  ongoing commitment to your health goals. Please review the following plan we discussed and let me know if I can assist you in the future.   This is a list of the screening recommended for you and due dates:  Health Maintenance  Topic Date Due   HIV Screening  Never done   Cologuard (Stool DNA test)  Never done   Zoster (Shingles) Vaccine (1 of 2) Never done   Pneumonia Vaccine (3 of 3 - PPSV23 or PCV20) 08/24/2023   DEXA scan (bone density measurement)  Never done   COVID-19 Vaccine (3 - 2024-25 season) 02/21/2024*   Mammogram  08/14/2024   DTaP/Tdap/Td vaccine (3 - Td or Tdap) 01/19/2032   Flu Shot  Completed   Hepatitis C Screening  Completed   HPV Vaccine  Aged Out   Colon Cancer Screening  Discontinued  *Topic was postponed. The date shown is not the original due date.   I recommend getting the new shingles vaccine (Shingrix). Since you have Medicare, you will need to get this from the pharmacy, as it is covered by Part D. This is a series of 2 injections, spaced 2 months apart.   This should be separated from other vaccines by at least 2 weeks.  COVID vaccine is recommended.  Please return your Cologuard kit. Be sure to follow the directions closely.  We are faxing an order to [facility] for you to have a bone density  test done.  Please be sure to schedule this.  Please avoid using any seasonings that contain sodium . It is important to be strict on limiting the salt/sodium in your diet. This can contribute to swelling, as well as higher blood pressures. You have a lot of variation in your blood pressure readings, so I suspect diet may be playing a part. Please log "comments" onto your blood pressure sheet to see what helps and what hurts--ie if blood pressure is lower when you've exercised, higher when you had a saltier meal, etc, then you can figure out what to do more of, and what to do less of. If your blood pressure remains consistently >135/85 (which it pretty much has been  recently), we might need to make further medication changes.  I encourage you to wear compression socks (knee high length) while working. This can help prevent swelling that occurs with prolonged standing or sitting.  Try and avoid all soda. Instead try seltzer (look for unsweetened ones, if you need a flavor).  Try and increase your pace on the treadmill, gradually, or with faster intervals. Ideally get 150 minutes at a minimum on the treadmill, shooting for 1.5 miles at a time if you can.  Please bring [place] copies of your Living Will and Healthcare Power of Attorney once completed and notarized so that it can be scanned into your medical chart.   We are referring you to [facility], to help you with weight loss.
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
## 2024-02-06 ENCOUNTER — Encounter: Payer: Self-pay | Admitting: Family Medicine

## 2024-02-06 ENCOUNTER — Ambulatory Visit: Payer: Commercial Managed Care - PPO | Admitting: Family Medicine

## 2024-02-06 VITALS — BP 132/80 | HR 84 | Ht 60.5 in | Wt 218.0 lb

## 2024-02-06 DIAGNOSIS — Z23 Encounter for immunization: Secondary | ICD-10-CM | POA: Diagnosis not present

## 2024-02-06 DIAGNOSIS — E042 Nontoxic multinodular goiter: Secondary | ICD-10-CM | POA: Diagnosis not present

## 2024-02-06 DIAGNOSIS — Z6841 Body Mass Index (BMI) 40.0 and over, adult: Secondary | ICD-10-CM

## 2024-02-06 DIAGNOSIS — R7301 Impaired fasting glucose: Secondary | ICD-10-CM

## 2024-02-06 DIAGNOSIS — E66813 Obesity, class 3: Secondary | ICD-10-CM | POA: Diagnosis not present

## 2024-02-06 DIAGNOSIS — Z13220 Encounter for screening for lipoid disorders: Secondary | ICD-10-CM

## 2024-02-06 DIAGNOSIS — Z0000 Encounter for general adult medical examination without abnormal findings: Secondary | ICD-10-CM

## 2024-02-06 DIAGNOSIS — R220 Localized swelling, mass and lump, head: Secondary | ICD-10-CM | POA: Diagnosis not present

## 2024-02-06 DIAGNOSIS — Z5181 Encounter for therapeutic drug level monitoring: Secondary | ICD-10-CM

## 2024-02-06 DIAGNOSIS — I10 Essential (primary) hypertension: Secondary | ICD-10-CM | POA: Diagnosis not present

## 2024-02-06 LAB — POCT GLYCOSYLATED HEMOGLOBIN (HGB A1C): Hemoglobin A1C: 5.4 % (ref 4.0–5.6)

## 2024-02-07 ENCOUNTER — Encounter: Payer: Self-pay | Admitting: Family Medicine

## 2024-02-07 DIAGNOSIS — E042 Nontoxic multinodular goiter: Secondary | ICD-10-CM | POA: Insufficient documentation

## 2024-02-07 LAB — CBC WITH DIFFERENTIAL/PLATELET
Basophils Absolute: 0 10*3/uL (ref 0.0–0.2)
Basos: 1 %
EOS (ABSOLUTE): 0.1 10*3/uL (ref 0.0–0.4)
Eos: 1 %
Hematocrit: 43.3 % (ref 34.0–46.6)
Hemoglobin: 13.9 g/dL (ref 11.1–15.9)
Immature Grans (Abs): 0 10*3/uL (ref 0.0–0.1)
Immature Granulocytes: 0 %
Lymphocytes Absolute: 1.5 10*3/uL (ref 0.7–3.1)
Lymphs: 23 %
MCH: 25.1 pg — ABNORMAL LOW (ref 26.6–33.0)
MCHC: 32.1 g/dL (ref 31.5–35.7)
MCV: 78 fL — ABNORMAL LOW (ref 79–97)
Monocytes Absolute: 0.4 10*3/uL (ref 0.1–0.9)
Monocytes: 7 %
Neutrophils Absolute: 4.3 10*3/uL (ref 1.4–7.0)
Neutrophils: 68 %
Platelets: 275 10*3/uL (ref 150–450)
RBC: 5.54 x10E6/uL — ABNORMAL HIGH (ref 3.77–5.28)
RDW: 14.8 % (ref 11.7–15.4)
WBC: 6.3 10*3/uL (ref 3.4–10.8)

## 2024-02-07 LAB — CMP14+EGFR
ALT: 17 [IU]/L (ref 0–32)
AST: 20 [IU]/L (ref 0–40)
Albumin: 4.5 g/dL (ref 3.9–4.9)
Alkaline Phosphatase: 67 [IU]/L (ref 44–121)
BUN/Creatinine Ratio: 18 (ref 12–28)
BUN: 14 mg/dL (ref 8–27)
Bilirubin Total: <0.2 mg/dL (ref 0.0–1.2)
CO2: 24 mmol/L (ref 20–29)
Calcium: 9.9 mg/dL (ref 8.7–10.3)
Chloride: 101 mmol/L (ref 96–106)
Creatinine, Ser: 0.76 mg/dL (ref 0.57–1.00)
Globulin, Total: 2.7 g/dL (ref 1.5–4.5)
Glucose: 102 mg/dL — ABNORMAL HIGH (ref 70–99)
Potassium: 4.2 mmol/L (ref 3.5–5.2)
Sodium: 140 mmol/L (ref 134–144)
Total Protein: 7.2 g/dL (ref 6.0–8.5)
eGFR: 87 mL/min/{1.73_m2} (ref >59)

## 2024-02-07 LAB — LIPID PANEL
Chol/HDL Ratio: 1.9 {ratio} (ref 0.0–4.4)
Cholesterol, Total: 230 mg/dL — ABNORMAL HIGH (ref 100–199)
HDL: 121 mg/dL (ref >39)
LDL Chol Calc (NIH): 98 mg/dL (ref 0–99)
Triglycerides: 64 mg/dL (ref 0–149)
VLDL Cholesterol Cal: 11 mg/dL (ref 5–40)

## 2024-02-07 LAB — TSH: TSH: 0.543 u[IU]/mL (ref 0.450–4.500)

## 2024-02-07 NOTE — Assessment & Plan Note (Signed)
 Risks reviewed.  Counseled in detail re: healthy diet, portions, exercise, wt loss. Discussed [facility] clinic, and referral was placed.

## 2024-02-07 NOTE — Assessment & Plan Note (Signed)
 S/p benign biopsy in the past. Exam stable

## 2024-02-07 NOTE — Assessment & Plan Note (Signed)
 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 She denies change, but there has been slight increase in size, gradually.  Discussed options of checking CT and/or ENT referral.  She declines either of these. Understands the potential for a tumor/cancer.  She will contact us when she is ready for this, especially if she notices any changes (increase in size, pain, etc)

## 2024-02-07 NOTE — Assessment & Plan Note (Signed)
 A1c normal today. Cont to limit carbs, sweets, sugary beverages (to stop soda). Encouraged daily exercise and weight loss.

## 2024-02-07 NOTE — Assessment & Plan Note (Signed)
 BP okay in office, higher at home. Monitor prev verified as accurate--wrist monitor, reviewed proper position. Reviewed low Na diet in detail, increased exercise and weight loss. Cont current meds, amlodipine 10mg  and benicar-HCT. No pitting edema noted today (early morning); recommended compression stockings when working.

## 2024-02-22 ENCOUNTER — Other Ambulatory Visit: Payer: Self-pay

## 2024-02-22 ENCOUNTER — Other Ambulatory Visit (HOSPITAL_COMMUNITY): Payer: Self-pay

## 2024-02-22 ENCOUNTER — Other Ambulatory Visit: Payer: Self-pay | Admitting: Family Medicine

## 2024-02-22 MED ORDER — ALBUTEROL SULFATE HFA 108 (90 BASE) MCG/ACT IN AERS
2.0000 | INHALATION_SPRAY | Freq: Four times a day (QID) | RESPIRATORY_TRACT | 1 refills | Status: AC | PRN
  Filled 2024-02-22: qty 6.7, 25d supply, fill #0
  Filled 2025-01-09: qty 6.7, 25d supply, fill #1

## 2024-02-22 NOTE — Telephone Encounter (Signed)
 Per Dr. Lynelle Doctor recent visit, pt uses this medication some in allergy season and uses as needed

## 2024-02-26 ENCOUNTER — Other Ambulatory Visit (HOSPITAL_COMMUNITY): Payer: Self-pay

## 2024-02-27 ENCOUNTER — Other Ambulatory Visit (HOSPITAL_COMMUNITY): Payer: Self-pay

## 2024-02-27 ENCOUNTER — Other Ambulatory Visit: Payer: Self-pay

## 2024-03-17 ENCOUNTER — Other Ambulatory Visit: Payer: Self-pay | Admitting: Family Medicine

## 2024-03-17 ENCOUNTER — Other Ambulatory Visit (HOSPITAL_COMMUNITY): Payer: Self-pay

## 2024-03-17 DIAGNOSIS — I10 Essential (primary) hypertension: Secondary | ICD-10-CM

## 2024-03-17 DIAGNOSIS — J45909 Unspecified asthma, uncomplicated: Secondary | ICD-10-CM

## 2024-03-17 MED ORDER — MONTELUKAST SODIUM 10 MG PO TABS
10.0000 mg | ORAL_TABLET | Freq: Every day | ORAL | 0 refills | Status: DC
  Filled 2024-03-17: qty 90, 90d supply, fill #0

## 2024-03-17 MED ORDER — OLMESARTAN MEDOXOMIL-HCTZ 40-25 MG PO TABS
1.0000 | ORAL_TABLET | Freq: Every day | ORAL | 0 refills | Status: DC
  Filled 2024-03-17: qty 90, 90d supply, fill #0

## 2024-04-20 ENCOUNTER — Other Ambulatory Visit: Payer: Self-pay | Admitting: Family Medicine

## 2024-04-20 DIAGNOSIS — I10 Essential (primary) hypertension: Secondary | ICD-10-CM

## 2024-04-21 ENCOUNTER — Other Ambulatory Visit (HOSPITAL_COMMUNITY): Payer: Self-pay

## 2024-04-21 MED ORDER — AMLODIPINE BESYLATE 10 MG PO TABS
10.0000 mg | ORAL_TABLET | Freq: Every day | ORAL | 2 refills | Status: DC
  Filled 2024-04-21: qty 90, 90d supply, fill #0
  Filled 2024-07-16: qty 90, 90d supply, fill #1
  Filled 2024-10-14: qty 90, 90d supply, fill #2

## 2024-05-01 ENCOUNTER — Ambulatory Visit (HOSPITAL_COMMUNITY)
Admission: EM | Admit: 2024-05-01 | Discharge: 2024-05-01 | Disposition: A | Discharge status: Home / Self Care | Attending: Family Medicine | Admitting: Family Medicine

## 2024-05-01 ENCOUNTER — Encounter (HOSPITAL_COMMUNITY): Payer: Self-pay

## 2024-05-01 ENCOUNTER — Other Ambulatory Visit (HOSPITAL_COMMUNITY): Payer: Self-pay

## 2024-05-01 DIAGNOSIS — J329 Chronic sinusitis, unspecified: Secondary | ICD-10-CM

## 2024-05-01 DIAGNOSIS — J069 Acute upper respiratory infection, unspecified: Secondary | ICD-10-CM | POA: Diagnosis not present

## 2024-05-01 DIAGNOSIS — B9689 Other specified bacterial agents as the cause of diseases classified elsewhere: Secondary | ICD-10-CM | POA: Diagnosis not present

## 2024-05-01 MED ORDER — CEPHALEXIN 500 MG PO CAPS
500.0000 mg | ORAL_CAPSULE | Freq: Two times a day (BID) | ORAL | 0 refills | Status: AC
  Filled 2024-05-01: qty 10, 5d supply, fill #0

## 2024-05-01 NOTE — Discharge Instructions (Signed)
 You have an upper respiratory infection. Most cases are due to a virus and do not require antibiotics for treatment.  He also have a sinusitis.  Continue to monitor your symptoms for the next 1-2 days.  If they do not improve or worsen pick up the antibiotics from the pharmacy and take them until they are completely gone.  Make sure to take these with food and plenty of water. Make sure to continue good oral hydration.  You can take tylenol  for fever as needed Start Flonase  daily for the next week and then as needed. You can use nasal saline spray multiple times daily as well.  You can use a daily antihistamine or guaifenesin  as an expectorant but you need to be well hydrated for these medications to work.  Get adequate rest for recovery Maintain distance from others and wear a mask in public areas to avoid spread  If you start to experience shortness of breath, fevers that don't respond to medication, confusion, profound neck stiffness, or fainting, return to the urgent care or ED.

## 2024-05-01 NOTE — ED Triage Notes (Signed)
 Chief Complaint: slight cough, congestion, earaches, and nasal congestion.   Sick exposure: No  Onset: 6 days   Prescriptions or OTC medications tried: Yes- Singulair , nasal spray    with no relief  New foods, medications, or products: No  Recent Travel: No

## 2024-05-01 NOTE — ED Provider Notes (Signed)
 [facility]    CSN: [id number] Arrival date & time: 05/01/24  4098      History   Chief Complaint Chief Complaint  Patient presents with   Otalgia   Nasal Congestion    HPI Natasha Bauer  is a 68 y.o. female.   The patient presents with 6 days of ongoing ear pain, sinus pain, discharge, congestion, that is starting to slowly worsen.  On the first day she had a sore throat but this resolved.  She has tried Flonase , Tylenol , increasing her oral hydration but this is not helped her symptoms.  In the past she has been given a steroid Dosepak which usually does not help.  She denies any fevers, chills, chest pain, trouble breathing, cough, nausea, vomiting, diarrhea.  The history is provided by the patient.  Otalgia Associated symptoms: congestion, cough (mild), rhinorrhea and sore throat (in the mornings)   Associated symptoms: no diarrhea, no ear discharge, no fever, no headaches, no hearing loss, no neck pain, no rash and no vomiting     Past Medical History:  Diagnosis Date   Asthma    Colon polyp    hyperplastic in 2004 and 2013    Hypertension age 38   Seasonal allergies    Tenosynovitis, de Quervain    Dr. Aloha Arnold, resolved    Patient Active Problem List   Diagnosis Date Noted   Multiple thyroid  nodules 02/07/2024   Mass of left submandibular region 07/31/2023   Vitamin D  deficiency 07/31/2023   Impaired fasting glucose 07/31/2023   Family history of colon cancer in mother 09/29/2018   Class 3 severe obesity due to excess calories with serious comorbidity and body mass index (BMI) of 40.0 to 44.9 in adult 05/27/2015   Right thyroid  nodule 05/22/2014   Obesity, Class II, BMI 35-39.9 04/30/2013   Essential hypertension, benign 07/31/2012   Asthma 07/31/2012   Allergic rhinitis 07/31/2012    Past Surgical History:  Procedure Laterality Date   BREAST BIOPSY  08/09/2020   COLONOSCOPY  08/20/12   Dr. Nickey Barn (hyperplastic polyp)   LACERATION REPAIR  Right 10/16   cut finger   TOTAL ABDOMINAL HYSTERECTOMY W/ BILATERAL SALPINGOOPHORECTOMY  05/2003   fibroids    OB History     Gravida  0   Para  0   Term  0   Preterm  0   AB  0   Living  0      SAB  0   IAB  0   Ectopic  0   Multiple  0   Live Births               Home Medications    Prior to Admission medications   Medication Sig Start Date End Date Taking? Authorizing Provider  albuterol  (VENTOLIN  HFA) 108 (90 Base) MCG/ACT inhaler Inhale 2 puffs into the lungs every 6 (six) hours as needed for wheezing or shortness of breath. 02/22/24  Yes Roosvelt Colla, MD  amLODipine  (NORVASC ) 10 MG tablet Take 1 tablet (10 mg total) by mouth daily. 04/21/24  Yes Roosvelt Colla, MD  cephALEXin (KEFLEX) 500 MG capsule Take 1 capsule (500 mg total) by mouth 2 (two) times daily for 5 days. 05/01/24 05/06/24 Yes Claybon Cuna, MD  cholecalciferol (VITAMIN D3) 25 MCG (1000 UNIT) tablet Take 1,000 Units by mouth daily.   Yes [provider]  docusate sodium  (COLACE) 100 MG capsule Take 1 capsule (100 mg total) by mouth every 12 (twelve) hours. 10/26/23  Yes Garrison, [place]  N, FNP  fluticasone  (FLONASE ) 50 MCG/ACT nasal spray Place 2 sprays into both nostrils daily. 02/13/23  Yes Roosvelt Colla, MD  ipratropium (ATROVENT ) 0.03 % nasal spray Place 2 sprays into both nostrils every 12 (twelve) hours. 12/06/23  Yes Lamptey, Donley Furth, MD  montelukast  (SINGULAIR ) 10 MG tablet Take 1 tablet (10 mg total) by mouth at bedtime. 03/17/24  Yes Roosvelt Colla, MD  Multiple Vitamins-Minerals (CENTRUM SILVER PO) Take 1 tablet by mouth daily.   Yes [provider]  olmesartan -hydrochlorothiazide  (BENICAR  HCT) 40-25 MG tablet Take 1 tablet by mouth daily. 03/17/24 06/16/24 Yes Roosvelt Colla, MD    Family History Family History  Problem Relation Age of Onset   Hypertension Father    Congestive Heart Failure Father    Heart disease Father    Cancer Mother 65       colon cancer   Diabetes  Mother    Colon cancer Mother 64   Arthritis Sister        rheumatoid arthritis   Hypertension Sister    Arthritis Brother        rheumatoid arthritis    Social History Social History   Tobacco Use   Smoking status: Former    Current packs/day: 0.00    Types: Cigarettes    Quit date: 12/12/1991    Years since quitting: 32.4   Smokeless tobacco: Never  Vaping Use   Vaping status: Never Used  Substance Use Topics   Alcohol use: No    Alcohol/week: 0.0 standard drinks of alcohol    Comment: sip of wine at Christmas   Drug use: No     Allergies   Codeine, Penicillins, and Sinus & allergy [pseudoephedrine]   Review of Systems Review of Systems  Constitutional:  Negative for appetite change, chills and fever.  HENT:  Positive for congestion, ear pain, postnasal drip, rhinorrhea, sinus pressure, sinus pain and sore throat (in the mornings). Negative for dental problem, ear discharge, facial swelling, hearing loss, mouth sores, nosebleeds, trouble swallowing and voice change.   Eyes:  Negative for redness and visual disturbance.  Respiratory:  Positive for cough (mild). Negative for shortness of breath.   Cardiovascular:  Negative for chest pain.  Gastrointestinal:  Negative for diarrhea, nausea and vomiting.  Musculoskeletal:  Negative for neck pain and neck stiffness.  Skin:  Negative for rash.  Neurological:  Negative for dizziness, light-headedness and headaches.     Physical Exam Triage Vital Signs ED Triage Vitals  Encounter Vitals Group     BP 05/01/24 0835 129/69     Systolic BP Percentile --      Diastolic BP Percentile --      Pulse Rate 05/01/24 0835 87     Resp 05/01/24 0835 18     Temp 05/01/24 0835 (!) 97.4 F (36.3 C)     Temp Source 05/01/24 0835 Oral     SpO2 05/01/24 0835 96 %     Weight --      Height --      Head Circumference --      Peak Flow --      Pain Score 05/01/24 0830 7     Pain Loc --      Pain Education --      Exclude from  Growth Chart --    No data found.  Updated Vital Signs BP 129/69 (BP Location: Left Arm)   Pulse 87   Temp (!) 97.4 F (36.3 C) (Oral)  Resp 18   SpO2 96%   Visual Acuity Right Eye Distance:   Left Eye Distance:   Bilateral Distance:    Right Eye Near:   Left Eye Near:    Bilateral Near:     Physical Exam Vitals reviewed.  Constitutional:      General: She is not in acute distress.    Appearance: Normal appearance. She is not ill-appearing, toxic-appearing or diaphoretic.  HENT:     Head: Normocephalic and atraumatic.     Comments: Ttp over the maxillary > frontal sinuses     Right Ear: Tympanic membrane normal.     Left Ear: Tympanic membrane normal.     Ears:     Comments: Purulent effusion present bilaterally     Nose: Congestion present.     Mouth/Throat:     Mouth: Mucous membranes are moist.     Pharynx: No oropharyngeal exudate or posterior oropharyngeal erythema.  Eyes:     General: No scleral icterus.       Right eye: No discharge.        Left eye: No discharge.     Extraocular Movements: Extraocular movements intact.     Conjunctiva/sclera: Conjunctivae normal.     Pupils: Pupils are equal, round, and reactive to light.  Cardiovascular:     Rate and Rhythm: Normal rate and regular rhythm.     Pulses: Normal pulses.     Heart sounds: Murmur (2+ systolic) heard.  Pulmonary:     Effort: Pulmonary effort is normal.     Breath sounds: Normal breath sounds.  Skin:    General: Skin is warm.     Capillary Refill: Capillary refill takes 2 to 3 seconds.     Findings: No erythema or rash.  Neurological:     Mental Status: She is alert.      UC Treatments / Results  Labs (all labs ordered are listed, but only abnormal results are displayed) Labs Reviewed - No data to display  EKG   Radiology No results found.  Procedures Procedures (including critical care time)  Medications Ordered in UC Medications - No data to display  Initial  Impression / Assessment and Plan / UC Course  I have reviewed the triage vital signs and the nursing notes.  Pertinent labs & imaging results that were available during my care of the patient were reviewed by me and considered in my medical decision making (see chart for details).    URI with acute bacterial sinusitis - The patient is stable with no respiratory distress.  -Given her length of symptoms that are starting to worsen I will send in a course of antibiotics to the pharmacy.  She is going to pick this up in the next 1-2 days if her symptoms do not begin to improve. - I did review the patient's allergy to penicillins.  She states that she had a headache in the past but no other rash, or symptoms of anaphylaxis. - We discussed symptomatic management.  - The patient can use daily fluticasone  and saline nasal spray for congestion. They can use an oral antihistamine or guaifenesin  with increased hydration as well. Hot showers for humidified air and use a bedside humidifier can also benefit if available.  - I encouraged proper intake of fruits, vegetables, and protein as well of plenty of rest.  - We discussed the use of masking in public areas to avoid spread.  - Return criteria discussed. The patient agreed with the plan and voiced  understanding. All questions were answered.   Final Clinical Impressions(s) / UC Diagnoses   Final diagnoses:  Bacterial sinusitis  Viral upper respiratory tract infection     Discharge Instructions      You have an upper respiratory infection. Most cases are due to a virus and do not require antibiotics for treatment.  He also have a sinusitis.  Continue to monitor your symptoms for the next 1-2 days.  If they do not improve or worsen pick up the antibiotics from the pharmacy and take them until they are completely gone.  Make sure to take these with food and plenty of water. Make sure to continue good oral hydration.  You can take tylenol  for fever as  needed Start Flonase  daily for the next week and then as needed. You can use nasal saline spray multiple times daily as well.  You can use a daily antihistamine or guaifenesin  as an expectorant but you need to be well hydrated for these medications to work.  Get adequate rest for recovery Maintain distance from others and wear a mask in public areas to avoid spread  If you start to experience shortness of breath, fevers that don't respond to medication, confusion, profound neck stiffness, or fainting, return to the urgent care or ED.     ED Prescriptions     Medication Sig Dispense Auth. Provider   cephALEXin (KEFLEX) 500 MG capsule Take 1 capsule (500 mg total) by mouth 2 (two) times daily for 5 days. 10 capsule Claybon Cuna, MD      PDMP not reviewed this encounter.   Claybon Cuna, MD 05/01/24 [id number]

## 2024-06-20 ENCOUNTER — Other Ambulatory Visit: Payer: Self-pay

## 2024-06-20 ENCOUNTER — Other Ambulatory Visit: Payer: Self-pay | Admitting: Family Medicine

## 2024-06-20 ENCOUNTER — Other Ambulatory Visit (HOSPITAL_COMMUNITY): Payer: Self-pay

## 2024-06-20 DIAGNOSIS — I10 Essential (primary) hypertension: Secondary | ICD-10-CM

## 2024-06-20 DIAGNOSIS — J45909 Unspecified asthma, uncomplicated: Secondary | ICD-10-CM

## 2024-06-20 MED ORDER — OLMESARTAN MEDOXOMIL-HCTZ 40-25 MG PO TABS
1.0000 | ORAL_TABLET | Freq: Every day | ORAL | 1 refills | Status: DC
  Filled 2024-06-20: qty 90, 90d supply, fill #0
  Filled 2024-09-15: qty 90, 90d supply, fill #1

## 2024-06-20 MED ORDER — MONTELUKAST SODIUM 10 MG PO TABS
10.0000 mg | ORAL_TABLET | Freq: Every day | ORAL | 1 refills | Status: DC
  Filled 2024-06-20: qty 90, 90d supply, fill #0
  Filled 2024-09-15: qty 90, 90d supply, fill #1

## 2024-07-16 ENCOUNTER — Other Ambulatory Visit (HOSPITAL_COMMUNITY): Payer: Self-pay

## 2024-08-20 DIAGNOSIS — Z1231 Encounter for screening mammogram for malignant neoplasm of breast: Secondary | ICD-10-CM | POA: Diagnosis not present

## 2024-08-20 DIAGNOSIS — Z780 Asymptomatic menopausal state: Secondary | ICD-10-CM | POA: Diagnosis not present

## 2024-08-20 LAB — HM DEXA SCAN: HM Dexa Scan: NORMAL

## 2024-08-20 LAB — HM MAMMOGRAPHY

## 2024-08-22 ENCOUNTER — Encounter: Payer: Self-pay | Admitting: Family Medicine

## 2024-08-24 ENCOUNTER — Ambulatory Visit: Payer: Self-pay | Admitting: Family Medicine

## 2024-09-15 ENCOUNTER — Other Ambulatory Visit: Payer: Self-pay

## 2024-10-14 ENCOUNTER — Other Ambulatory Visit (HOSPITAL_COMMUNITY): Payer: Self-pay

## 2024-12-10 ENCOUNTER — Other Ambulatory Visit (HOSPITAL_BASED_OUTPATIENT_CLINIC_OR_DEPARTMENT_OTHER): Payer: Self-pay

## 2024-12-10 ENCOUNTER — Other Ambulatory Visit: Payer: Self-pay

## 2024-12-10 ENCOUNTER — Other Ambulatory Visit: Payer: Self-pay | Admitting: Family Medicine

## 2024-12-10 DIAGNOSIS — I10 Essential (primary) hypertension: Secondary | ICD-10-CM

## 2024-12-10 MED ORDER — OLMESARTAN MEDOXOMIL-HCTZ 40-25 MG PO TABS
1.0000 | ORAL_TABLET | Freq: Every day | ORAL | 0 refills | Status: AC
  Filled 2024-12-10: qty 90, 90d supply, fill #0

## 2025-01-09 ENCOUNTER — Other Ambulatory Visit: Payer: Self-pay | Admitting: Family Medicine

## 2025-01-09 ENCOUNTER — Other Ambulatory Visit: Payer: Self-pay

## 2025-01-09 DIAGNOSIS — J45909 Unspecified asthma, uncomplicated: Secondary | ICD-10-CM

## 2025-01-09 DIAGNOSIS — I10 Essential (primary) hypertension: Secondary | ICD-10-CM

## 2025-01-09 MED ORDER — AMLODIPINE BESYLATE 10 MG PO TABS
10.0000 mg | ORAL_TABLET | Freq: Every day | ORAL | 0 refills | Status: AC
  Filled 2025-01-09: qty 90, 90d supply, fill #0

## 2025-01-09 MED ORDER — MONTELUKAST SODIUM 10 MG PO TABS
10.0000 mg | ORAL_TABLET | Freq: Every day | ORAL | 0 refills | Status: AC
  Filled 2025-01-09: qty 90, 90d supply, fill #0

## 2025-01-12 ENCOUNTER — Encounter: Payer: Self-pay | Admitting: Pharmacist

## 2025-01-12 ENCOUNTER — Other Ambulatory Visit: Payer: Self-pay | Admitting: Family Medicine

## 2025-01-12 ENCOUNTER — Other Ambulatory Visit: Payer: Self-pay

## 2025-01-12 ENCOUNTER — Other Ambulatory Visit (HOSPITAL_COMMUNITY): Payer: Self-pay

## 2025-01-12 MED ORDER — IPRATROPIUM BROMIDE 0.03 % NA SOLN
2.0000 | Freq: Two times a day (BID) | NASAL | 0 refills | Status: AC
  Filled 2025-01-12: qty 30, 75d supply, fill #0

## 2025-01-13 ENCOUNTER — Other Ambulatory Visit (HOSPITAL_COMMUNITY): Payer: Self-pay

## 2025-02-25 ENCOUNTER — Ambulatory Visit: Payer: Medicare HMO | Admitting: Family Medicine
